# Patient Record
Sex: Male | Born: 1960 | Race: Black or African American | Hispanic: No | Marital: Married | Smoking: Never smoker
Health system: Southern US, Community
[De-identification: ages and names within clinical notes are randomized; demographics above are authoritative.]

## PROBLEM LIST (undated history)

## (undated) ENCOUNTER — Ambulatory Visit: Admission: EM | Payer: PRIVATE HEALTH INSURANCE | Source: Home / Self Care

## (undated) DIAGNOSIS — K219 Gastro-esophageal reflux disease without esophagitis: Secondary | ICD-10-CM

## (undated) HISTORY — DX: Gastro-esophageal reflux disease without esophagitis: K21.9

---

## 2009-03-23 ENCOUNTER — Ambulatory Visit: Payer: Self-pay | Admitting: Internal Medicine

## 2009-03-23 DIAGNOSIS — J3089 Other allergic rhinitis: Secondary | ICD-10-CM | POA: Insufficient documentation

## 2009-03-23 DIAGNOSIS — K219 Gastro-esophageal reflux disease without esophagitis: Secondary | ICD-10-CM

## 2009-03-23 DIAGNOSIS — J45909 Unspecified asthma, uncomplicated: Secondary | ICD-10-CM | POA: Insufficient documentation

## 2009-03-23 HISTORY — DX: Gastro-esophageal reflux disease without esophagitis: K21.9

## 2009-03-23 HISTORY — DX: Other allergic rhinitis: J30.89

## 2009-03-23 LAB — CONVERTED CEMR LAB
Cholesterol: 229 mg/dL — ABNORMAL HIGH (ref 0–200)
HDL: 45 mg/dL (ref >39.00)
PSA: 0.64 ng/mL (ref 0.10–4.00)
Total CHOL/HDL Ratio: 5
Triglycerides: 280 mg/dL — ABNORMAL HIGH (ref 0.0–149.0)

## 2009-03-25 ENCOUNTER — Encounter: Payer: Self-pay | Admitting: Internal Medicine

## 2010-04-18 ENCOUNTER — Ambulatory Visit: Payer: Self-pay | Admitting: Internal Medicine

## 2010-04-18 ENCOUNTER — Encounter: Payer: Self-pay | Admitting: Gastroenterology

## 2010-04-18 ENCOUNTER — Encounter: Payer: Self-pay | Admitting: Internal Medicine

## 2010-04-18 LAB — CONVERTED CEMR LAB
PSA: 0.72 ng/mL (ref 0.10–4.00)
Total CHOL/HDL Ratio: 4
Triglycerides: 108 mg/dL (ref 0.0–149.0)
VLDL: 21.6 mg/dL (ref 0.0–40.0)

## 2010-04-19 ENCOUNTER — Encounter: Payer: Self-pay | Admitting: Internal Medicine

## 2010-05-23 ENCOUNTER — Encounter (INDEPENDENT_AMBULATORY_CARE_PROVIDER_SITE_OTHER): Payer: Self-pay | Admitting: *Deleted

## 2010-05-27 ENCOUNTER — Ambulatory Visit: Payer: Self-pay | Admitting: Gastroenterology

## 2010-06-10 ENCOUNTER — Ambulatory Visit: Payer: Self-pay | Admitting: Gastroenterology

## 2010-06-12 ENCOUNTER — Encounter: Payer: Self-pay | Admitting: Gastroenterology

## 2010-10-01 NOTE — Procedures (Signed)
Summary: Colonoscopy  Patient: Frank Roy Note: All result statuses are Final unless otherwise noted.  Tests: (1) Colonoscopy (COL)   COL Colonoscopy           DONE     [facility]     [address]     [place], [place]  [address]           COLONOSCOPY PROCEDURE REPORT           PATIENT:  [name], [name]  MR#:  [id number]     BIRTHDATE:  [birth year], 49 yrs. old  GENDER:  male     ENDOSCOPIST:  Rachael Fee, MD     REF. BY:  Etta Grandchild, M.D.     PROCEDURE DATE:  06/10/2010     PROCEDURE:  Colonoscopy with biopsy     ASA CLASS:  Class II     INDICATIONS:  Routine Risk Screening     MEDICATIONS:   Fentanyl 25 mcg IV, Versed 4 mg IV     DESCRIPTION OF PROCEDURE:   After the risks benefits and     alternatives of the procedure were thoroughly explained, informed     consent was obtained.  Digital rectal exam was performed and     revealed no rectal masses.   The LB PCF-H180AL [id number] endoscope     was introduced through the anus and advanced to the cecum, which     was identified by both the appendix and ileocecal valve, without     limitations.  The quality of the prep was excellent, using     MoviPrep.  The instrument was then slowly withdrawn as the colon     was fully examined.           <<PROCEDUREIMAGES>>           FINDINGS:  A diminutive polyp was found in the rectum. This was     1-15mm across, removed with forceps and sent to pathology (jar 1)     (see image3).  This was otherwise a normal examination of the     colon (see image1, image2, and image4).   Retroflexed views in the     rectum revealed no abnormalities.    The scope was then withdrawn     from the patient and the procedure completed.           COMPLICATIONS:  None     ENDOSCOPIC IMPRESSION:     1) Diminutive polyp in the rectum, removed and sent to pathology           2) Otherwise normal examination           RECOMMENDATIONS:     1) If the polyp(s) removed today are proven to be  adenomatous     (pre-cancerous) polyps, you will need a repeat colonoscopy in 5     years. Otherwise you should continue to follow colorectal cancer     screening guidelines for "routine risk" patients with colonoscopy     in 10 years.     2) You will receive a letter within 1-2 weeks with the results     of your biopsy as well as final recommendations. Please call my     office if you have not received a letter after 3 weeks.           ______________________________     Rachael Fee, MD           n.  eSIGNED:   Rachael Fee at 06/10/2010 08:52 AM           [name], [id number]  Note: An exclamation mark (!) indicates a result that was not dispersed into the flowsheet. Document Creation Date: 06/10/2010 8:53 AM _______________________________________________________________________  (1) Order result status: Final Collection or observation date-time: 06/10/2010 08:47 Requested date-time:  Receipt date-time:  Reported date-time:  Referring Physician:   Ordering Physician: Rob Bunting [phone]) Specimen Source:  Source: Launa Grill Order Number: [id number] Lab site:   Appended Document: Colonoscopy     Procedures Next Due Date:    Colonoscopy: 06/2020

## 2010-10-01 NOTE — Letter (Signed)
Summary: Lipid Letter  [facility]  [address] [place], [place] [address]   Phone: [phone]  Fax: [phone]    04/19/2010  Frank Roy [address] [place], [place]  [address]  Dear Frank Roy:  We have carefully reviewed your last lipid profile from  and the results are noted below with a summary of recommendations for lipid management.    Cholesterol:       224     Goal: <200   HDL "good" Cholesterol:   57.84     Goal: >40   LDL "bad" Cholesterol:   159     Goal: <130   Triglycerides:       108.0     Goal: <150        TLC Diet (Therapeutic Lifestyle Change): Saturated Fats & Transfatty acids should be kept < 7% of total calories ***Reduce Saturated Fats Polyunstaurated Fat can be up to 10% of total calories Monounsaturated Fat Fat can be up to 20% of total calories Total Fat should be no greater than 25-35% of total calories Carbohydrates should be 50-60% of total calories Protein should be approximately 15% of total calories Fiber should be at least 20-30 grams a day ***Increased fiber may help lower LDL Total Cholesterol should be < 200mg /day Consider adding plant stanol/sterols to diet (example: Benacol spread) ***A higher intake of unsaturated fat may reduce Triglycerides and Increase HDL    Adjunctive Measures (may lower LIPIDS and reduce risk of Heart Attack) include: Aerobic Exercise (20-30 minutes 3-4 times a week) Limit Alcohol Consumption Weight Reduction Aspirin 75-81 mg a day by mouth (if not allergic or contraindicated) Dietary Fiber 20-30 grams a day by mouth     Current Medications: 1)    Proair Hfa 108 (90 Base) Mcg/act Aers (Albuterol sulfate) .Marland Kitchen.. 1-2 puffs qid as needed for wheezing  If you have any questions, please call. We appreciate being able to work with you.   Sincerely,    [facility] Etta Grandchild MD

## 2010-10-01 NOTE — Letter (Signed)
Summary: Results Letter  [facility]  [address] [place], [place] [address]   Phone: [phone]  Fax: [phone]        June 12, 2010 MRN: [id number]    Frank Roy [address] [place], [place]  [address]    Dear Mr. Gloss,   Good news.  The polyp that was removed during your recent procedure was NOT pre-cancerous.  You should continue to follow current colorectal cancer screening guidelines with a repeat colonoscopy in 10 years.  We will therefore put your information in our reminder system and will contact you in 10 years to schedule a repeat procedure.  Please call if you have any questions or concerns.       Sincerely,  Rachael Fee MD  This letter has been electronically signed by your physician.  Appended Document: Results Letter letter mailed

## 2010-10-01 NOTE — Letter (Signed)
Summary: [facility] Instructions  [facility]  [address] [place], [place] [address]   Phone: [phone]  Fax: [phone]       Frank Roy    1961-08-11    MRN: [id number]        Procedure Day /Date: 06/10/10   Monday     Arrival Time:  7:30am      Procedure Time: 8:30am     Location of Procedure:                    _x _  [facility] (4th Floor)   PREPARATION FOR COLONOSCOPY WITH MOVIPREP   Starting 5 days prior to your procedure _10/5/11 _ do not eat nuts, seeds, popcorn, corn, beans, peas,  salads, or any raw vegetables.  Do not take any fiber supplements (e.g. Metamucil, Citrucel, and Benefiber).  THE DAY BEFORE YOUR PROCEDURE         DATE:  06/09/10  DAY:  Sunday  1.  Drink clear liquids the entire day-NO SOLID FOOD  2.  Do not drink anything colored red or purple.  Avoid juices with pulp.  No orange juice.  3.  Drink at least 64 oz. (8 glasses) of fluid/clear liquids during the day to prevent dehydration and help the prep work efficiently.  CLEAR LIQUIDS INCLUDE: Water Jello Ice Popsicles Tea (sugar ok, no milk/cream) Powdered fruit flavored drinks Coffee (sugar ok, no milk/cream) Gatorade Juice: apple, white grape, white cranberry  Lemonade Clear bullion, consomm, broth Carbonated beverages (any kind) Strained chicken noodle soup Hard Candy                             4.  In the morning, mix first dose of MoviPrep solution:    Empty 1 Pouch A and 1 Pouch B into the disposable container    Add lukewarm drinking water to the top line of the container. Mix to dissolve    Refrigerate (mixed solution should be used within 24 hrs)  5.  Begin drinking the prep at 5:00 p.m. The MoviPrep container is divided by 4 marks.   Every 15 minutes drink the solution down to the next mark (approximately 8 oz) until the full liter is complete.   6.  Follow completed prep with 16 oz of clear liquid of your choice (Nothing red or purple).   Continue to drink clear liquids until bedtime.  7.  Before going to bed, mix second dose of MoviPrep solution:    Empty 1 Pouch A and 1 Pouch B into the disposable container    Add lukewarm drinking water to the top line of the container. Mix to dissolve    Refrigerate  THE DAY OF YOUR PROCEDURE      DATE:  06/10/10  DAY:  Monday  Beginning at  3:30 a.m. (5 hours before procedure):         1. Every 15 minutes, drink the solution down to the next mark (approx 8 oz) until the full liter is complete.  2. Follow completed prep with 16 oz. of clear liquid of your choice.    3. You may drink clear liquids until  6:30am  (2 HOURS BEFORE PROCEDURE).   MEDICATION INSTRUCTIONS  Unless otherwise instructed, you should take regular prescription medications with a small sip of water   as early as possible the morning of your procedure.         OTHER  INSTRUCTIONS  You will need a responsible adult at least 51 years of age to accompany you and drive you home.   This person must remain in the waiting room during your procedure.  Wear loose fitting clothing that is easily removed.  Leave jewelry and other valuables at home.  However, you may wish to bring a book to read or  an iPod/MP3 player to listen to music as you wait for your procedure to start.  Remove all body piercing jewelry and leave at home.  Total time from sign-in until discharge is approximately 2-3 hours.  You should go home directly after your procedure and rest.  You can resume normal activities the  day after your procedure.  The day of your procedure you should not:   Drive   Make legal decisions   Operate machinery   Drink alcohol   Return to work  You will receive specific instructions about eating, activities and medications before you leave.    The above instructions have been reviewed and explained to me by  Karl Bales RN  May 27, 2010 9:18 AM    I fully understand and can  verbalize these instructions _____________________________ Date _________

## 2010-10-01 NOTE — Letter (Signed)
Summary: Previsit letter  [facility]  [address] [place], [place] [address]   Phone: [phone]  Fax: [phone]       04/18/2010 MRN: [id number]  Frank Roy [address] [place], [place]  [address]  Dear [name],  Welcome to the Gastroenterology Division at [facility].    You are scheduled to see a nurse for your pre-procedure visit on 05-27-10 at 9am on the 3rd floor at [facility], [address]  We ask that you try to arrive at our office 15 minutes prior to your appointment time to allow for check-in.  Your nurse visit will consist of discussing your medical and surgical history, your immediate family medical history, and your medications.    Please bring a complete list of all your medications or, if you prefer, bring the medication bottles and we will list them.  We will need to be aware of both prescribed and over the counter drugs.  We will need to know exact dosage information as well.  If you are on blood thinners (Coumadin, Plavix, Aggrenox, Ticlid, etc.) please call our office today/prior to your appointment, as we need to consult with your physician about holding your medication.   Please be prepared to read and sign documents such as consent forms, a financial agreement, and acknowledgement forms.  If necessary, and with your consent, a friend or relative is welcome to sit-in on the nurse visit with you.  Please bring your insurance card so that we may make a copy of it.  If your insurance requires a referral to see a specialist, please bring your referral form from your primary care physician.  No co-pay is required for this nurse visit.     If you cannot keep your appointment, please call [phone] to cancel or reschedule prior to your appointment date.  This allows [place] the opportunity to schedule an appointment for another patient in need of care.    Thank you for choosing [facility] for your medical needs.   We appreciate the opportunity to care for you.  Please visit [place] at our website  to learn more about our practice.                     Sincerely.                                                                                                                   The Gastroenterology Division

## 2010-10-01 NOTE — Assessment & Plan Note (Signed)
Summary: CPX/LAB AFTER APPT/BCBS/PN   Vital Signs:  Patient profile:   50 year old male Height:      70 inches Weight:      194.38 pounds BMI:     27.99 O2 Sat:      97 % on Room air Temp:     97.1 degrees F oral Pulse rate:   60 / minute Pulse rhythm:   regular Resp:     16 per minute BP sitting:   104 / 70  (left arm) Cuff size:   large  Vitals Entered By: Rock Nephew CMA (April 18, 2010 9:02 AM)  Nutrition Counseling: Patient's BMI is greater than 25 and therefore counseled on weight management options.  O2 Flow:  Room air CC: cpx w/labs, Preventive Care Is Patient Diabetic? No Pain Assessment Patient in pain? no       Does patient need assistance? Functional Status Self care Ambulation Normal   Primary Care Provider:  jones  CC:  cpx w/labs and Preventive Care.  History of Present Illness:  Follow-Up Visit      This is a 50 year old man who presents for Follow-up visit.  The patient denies chest pain, palpitations, dizziness, syncope, edema, SOB, DOE, PND, and orthopnea.  Since the last visit the patient notes no new problems or concerns.    Preventive Screening-Counseling & Management  Alcohol-Tobacco     Alcohol drinks/day: 0     Smoking Status: never  Hep-HIV-STD-Contraception     Hepatitis Risk: no risk noted     HIV Risk: no risk noted     STD Risk: no risk noted     Dental Visit-last 6 months yes     Dental Care Counseling: to seek dental care; no dental care within six months     TSE monthly: yes     Testicular SE Education/Counseling to perform regular STE  Safety-Violence-Falls     Seat Belt Use: yes     Helmet Use: no     Firearms in the Home: no firearms in the home     Smoke Detectors: yes     Violence in the Home: no risk noted     Sexual Abuse: no      Sexual History:  currently monogamous.        Drug Use:  no.        Blood Transfusions:  no.    Clinical Review Panels:  Prevention   Last PSA:  0.64  (03/23/2009)  Lipid Management   Cholesterol:  229 (03/23/2009)   HDL (good cholesterol):  45.00 (03/23/2009)   Problems Prior to Update: 1)  Routine General Medical Exam@health  Care Facl  (ICD-V70.0) 2)  Allergic Rhinitis Due To Other Allergen  (ICD-477.8) 3)  Gerd  (ICD-530.81) 4)  Asthma  (ICD-493.90)  Medications Prior to Update: 1)  Proair Hfa 108 (90 Base) Mcg/act Aers (Albuterol Sulfate) .Marland Kitchen.. 1-2 Puffs Qid As Needed For Wheezing  Current Medications (verified): 1)  Proair Hfa 108 (90 Base) Mcg/act Aers (Albuterol Sulfate) .Marland Kitchen.. 1-2 Puffs Qid As Needed For Wheezing  Allergies (verified): No Known Drug Allergies  Past History:  Past Medical History: Last updated: 03/23/2009 Asthma GERD  Past Surgical History: Last updated: 03/23/2009 Denies surgical history  Family History: Last updated: 04/18/2010 none  Social History: Last updated: 03/23/2009 Occupation: Track Psychologist, occupational at [employer] Married Never Smoked Alcohol use-no Drug use-no Regular exercise-yes  Risk Factors: Alcohol Use: 0 (04/18/2010) Exercise: yes (03/23/2009)  Risk Factors: Smoking Status: never (04/18/2010)  Family History: Reviewed history and no changes required. none  Social History: Reviewed history from 03/23/2009 and no changes required. Occupation: Product manager at [employer] Married Never Smoked Alcohol use-no Drug use-no Regular exercise-yes Hepatitis Risk:  no risk noted HIV Risk:  no risk noted STD Risk:  no risk noted Dental Care w/in 6 mos.:  yes Seat Belt Use:  yes Sexual History:  currently monogamous Blood Transfusions:  no  Review of Systems  The patient denies anorexia, fever, weight loss, weight gain, chest pain, syncope, dyspnea on exertion, peripheral edema, prolonged cough, headaches, hemoptysis, abdominal pain, melena, hematochezia, severe indigestion/heartburn, hematuria, suspicious skin lesions, difficulty walking, depression, abnormal bleeding, enlarged lymph  nodes, angioedema, and testicular masses.    Physical Exam  General:  alert, well-developed, well-nourished, well-hydrated, normal appearance, healthy-appearing, cooperative to examination, and good hygiene.   Head:  normocephalic and atraumatic.   Eyes:  vision grossly intact, pupils equal, pupils round, and pupils reactive to light.   Ears:  R ear normal and L ear normal.   Mouth:  good dentition, no gingival abnormalities, pharynx pink and moist, no erythema, no exudates, no posterior lymphoid hypertrophy, and no postnasal drip.   Neck:  supple, full ROM, no masses, no carotid bruits, no cervical lymphadenopathy, and no neck tenderness.   Chest Wall:  No deformities, masses, tenderness or gynecomastia noted. Breasts:  No masses or gynecomastia noted Lungs:  Normal respiratory effort, chest expands symmetrically. Lungs are clear to auscultation, no crackles or wheezes. Heart:  Normal rate and regular rhythm. S1 and S2 normal without gallop, murmur, click, rub or other extra sounds. Abdomen:  Bowel sounds positive,abdomen soft and non-tender without masses, organomegaly or hernias noted. Rectal:  No external abnormalities noted. Normal sphincter tone. No rectal masses or tenderness. heme negative stool. Genitalia:  circumcised, no hydrocele, no varicocele, no scrotal masses, no testicular masses or atrophy, no cutaneous lesions, and no urethral discharge.   Prostate:  no gland enlargement, no nodules, no asymmetry, and no induration.   Msk:  No deformity or scoliosis noted of thoracic or lumbar spine.   Pulses:  R and L carotid,radial,femoral,dorsalis pedis and posterior tibial pulses are full and equal bilaterally Extremities:  No clubbing, cyanosis, edema, or deformity noted with normal full range of motion of all joints.   Neurologic:  No cranial nerve deficits noted. Station and gait are normal. Plantar reflexes are down-going bilaterally. DTRs are symmetrical throughout. Sensory, motor and  coordinative functions appear intact. Skin:  turgor normal, color normal, no rashes, no suspicious lesions, no ecchymoses, no petechiae, no purpura, no ulcerations, no edema, and tattoo(s).   Cervical Nodes:  No lymphadenopathy noted Axillary Nodes:  No palpable lymphadenopathy Inguinal Nodes:  no R inguinal adenopathy and no L inguinal adenopathy.   Psych:  Cognition and judgment appear intact. Alert and cooperative with normal attention span and concentration. No apparent delusions, illusions, hallucinations   Impression & Recommendations:  Problem # 1:  ROUTINE GENERAL MEDICAL EXAM@HEALTH  CARE FACL (ICD-V70.0) Assessment Unchanged  Orders: Venipuncture ([address]) TLB-Lipid Panel (80061-LIPID) TLB-PSA (Prostate Specific Antigen) (84153-PSA) Gastroenterology Referral (GI) Hemoccult Guaiac-1 spec.(in office) (82270) EKG w/ Interpretation (93000)  Pneumovax: Pneumovax (03/23/2009) Chol: 229 (03/23/2009)   HDL: 45.00 (03/23/2009)   TG: 280.0 (03/23/2009) PSA: 0.64 (03/23/2009)  Discussed using sunscreen, use of alcohol, drug use, self testicular exam, routine dental care, routine eye care, routine physical exam, seat belts, multiple vitamins, and recommendations for immunizations.  Discussed exercise and checking cholesterol.  Also recommend checking PSA.  Complete Medication List: 1)  Proair Hfa 108 (90 Base) Mcg/act Aers (Albuterol sulfate) .Marland Kitchen.. 1-2 puffs qid as needed for wheezing  Colorectal Screening:  Current Recommendations:    Hemoccult: NEG X 1 today    Colonoscopy recommended: scheduled with [facility].  PSA Screening:    PSA: 0.64  (03/23/2009)    Reviewed PSA screening recommendations: PSA ordered  Immunization & Chemoprophylaxis:    Pneumovax: Pneumovax  (03/23/2009)  Patient Instructions: 1)  Please schedule a follow-up appointment as needed. 2)  It is important that you exercise regularly at least 20 minutes 5 times a week. If you develop chest pain, have severe  difficulty breathing, or feel very tired , stop exercising immediately and seek medical attention. 3)  You need to lose weight. Consider a lower calorie diet and regular exercise.  4)  If you are having sex and you or your partner don't want a child, use contraception.

## 2010-10-01 NOTE — Miscellaneous (Signed)
Summary: [facility] previsit  Clinical Lists Changes  Medications: Added new medication of MOVIPREP 100 GM  SOLR (PEG-KCL-NACL-NASULF-NA ASC-C) As per prep instructions. - Signed Rx of MOVIPREP 100 GM  SOLR (PEG-KCL-NACL-NASULF-NA ASC-C) As per prep instructions.;  #1 x 0;  Signed;  Entered by: Karl Bales RN;  Authorized by: Rachael Fee MD;  Method used: Electronically to [id number] [address] [place], [place]  [address], Ph: [phone], Fax: [phone]    Prescriptions: MOVIPREP 100 GM  SOLR (PEG-KCL-NACL-NASULF-NA ASC-C) As per prep instructions.  #1 x 0   Entered by:   Karl Bales RN   Authorized by:   Rachael Fee MD   Signed by:   Karl Bales RN on 05/27/2010   Method used:   Electronically to        [facility] [address] [id number]* (retail)       [address] [place], [place]  [address]       Ph: [phone]       Fax: [phone]   RxID:   [id number]

## 2011-04-03 ENCOUNTER — Ambulatory Visit (INDEPENDENT_AMBULATORY_CARE_PROVIDER_SITE_OTHER): Payer: BC Managed Care – PPO | Admitting: Internal Medicine

## 2011-04-03 ENCOUNTER — Encounter: Payer: Self-pay | Admitting: Internal Medicine

## 2011-04-03 VITALS — BP 120/70 | HR 57 | Temp 98.7°F | Resp 16 | Wt 190.2 lb

## 2011-04-03 DIAGNOSIS — J3089 Other allergic rhinitis: Secondary | ICD-10-CM

## 2011-04-03 DIAGNOSIS — N529 Male erectile dysfunction, unspecified: Secondary | ICD-10-CM

## 2011-04-03 MED ORDER — VARDENAFIL HCL 10 MG PO TBDP: 1.0000 | ORAL_TABLET | ORAL | Status: DC | PRN

## 2011-04-03 MED ORDER — MOMETASONE FUROATE 50 MCG/ACT NA SUSP: 2.0000 | Freq: Every day | NASAL | Status: DC

## 2011-04-03 NOTE — Assessment & Plan Note (Signed)
Start nasonex ns 

## 2011-04-03 NOTE — Assessment & Plan Note (Signed)
Try staxyn

## 2011-04-03 NOTE — Patient Instructions (Signed)
Allergic Rhinitis Allergic rhinitis is when the mucous membranes in the nose respond to allergens. Allergens are particles in the air that cause your body to have an allergic reaction. This causes you to release allergic antibodies. Through a chain of events, these eventually cause you to release histamine into the blood stream (hence the use of antihistamines). Although meant to be protective to the body, it is this release that causes your discomfort, such as frequent sneezing, congestion and an itchy runny nose.  CAUSES The pollen allergens may come from grasses, trees, and weeds. This is seasonal allergic rhinitis, or "hay fever." Other allergens cause year-round allergic rhinitis (perennial allergic rhinitis) such as house dust mite allergen, pet dander and mold spores.  SYMPTOMS  Nasal stuffiness (congestion).   Runny, itchy nose with sneezing and tearing of the eyes.   There is often an itching of the mouth, eyes and ears.  It cannot be cured, but it can be controlled with medications. DIAGNOSIS If you are unable to determine the offending allergen, skin or blood testing may find it. TREATMENT  Avoid the allergen.   Medications and allergy shots (immunotherapy) can help.   Hay fever may often be treated with antihistamines in pill or nasal spray forms. Antihistamines block the effects of histamine. There are over-the-counter medicines that may help with nasal congestion and swelling around the eyes. Check with your caregiver before taking or giving this medicine.  If the treatment above does not work, there are many new medications your caregiver can prescribe. Stronger medications may be used if initial measures are ineffective. Desensitizing injections can be used if medications and avoidance fails. Desensitization is when a patient is given ongoing shots until the body becomes less sensitive to the allergen. Make sure you follow up with your caregiver if problems continue. SEEK  MEDICAL CARE IF:   You develop fever (more than 100.5F (38.1 C).   You develop a cough that does not stop easily (persistent).   You have shortness of breath.   You start wheezing.   Symptoms interfere with normal daily activities.  Document Released: 05/13/2001 Document Re-Released: 09/09/2009 ExitCare Patient Information 2011 [employer]. 

## 2011-04-03 NOTE — Progress Notes (Signed)
Subjective:    Patient ID: Frank Roy, male    DOB: 1960/12/01, 50 y.o.   MRN: [id number]  Erectile Dysfunction This is a chronic problem. The current episode started more than 1 year ago. The problem is unchanged. The nature of his difficulty is maintaining erection, achieving erection and penetration. He reports no anxiety, decreased libido or performance anxiety. He reports his erection duration to be 1 to 5 minutes. Irritative symptoms do not include frequency, nocturia or urgency. Obstructive symptoms do not include dribbling, incomplete emptying, an intermittent stream, a slower stream, straining or a weak stream. Pertinent negatives include no chills, dysuria, genital pain, hematuria or hesitancy. The symptoms are aggravated by nothing. Past treatments include nothing.  Allergic Reaction This is a recurrent problem. The current episode started more than 1 week ago. The problem occurs intermittently. The problem has been gradually worsening since onset. The problem is moderate. Associated with: grasses in [place]. The time of exposure is unknown. Pertinent negatives include no abdominal pain, chest pain, chest pressure, coughing, diarrhea, difficulty breathing, drooling, eye itching, eye redness, eye watering, globus sensation, hyperventilation, itching, rash, stridor, trouble swallowing, vomiting or wheezing. There is no swelling present. Past treatments include nothing. His past medical history is significant for seasonal allergies.      Review of Systems  Constitutional: Negative for fever, chills, diaphoresis, activity change, appetite change, fatigue and unexpected weight change.  HENT: Positive for congestion, rhinorrhea, sneezing and postnasal drip. Negative for hearing loss, ear pain, nosebleeds, sore throat, drooling, mouth sores, trouble swallowing, neck pain, neck stiffness, dental problem, voice change, sinus pressure, tinnitus and ear discharge.   Eyes: Negative for photophobia,  pain, discharge, redness, itching and visual disturbance.  Respiratory: Negative for apnea, cough, choking, shortness of breath, wheezing and stridor.   Cardiovascular: Negative for chest pain, palpitations and leg swelling.  Gastrointestinal: Negative for nausea, vomiting, abdominal pain, diarrhea, constipation, abdominal distention, anal bleeding and rectal pain.  Genitourinary: Negative for dysuria, hesitancy, urgency, frequency, hematuria, flank pain, decreased urine volume, discharge, penile swelling, scrotal swelling, enuresis, difficulty urinating, genital sores, penile pain, testicular pain, decreased libido, incomplete emptying and nocturia.  Musculoskeletal: Negative.   Skin: Negative for color change, itching, pallor, rash and wound.  Neurological: Negative.   Hematological: Negative for adenopathy. Does not bruise/bleed easily.  Psychiatric/Behavioral: Negative.        Objective:   Physical Exam  Vitals reviewed. Constitutional: He is oriented to person, place, and time. He appears well-developed and well-nourished. No distress.  HENT:  Head: Normocephalic and atraumatic. No trismus in the jaw.  Right Ear: Hearing, tympanic membrane, external ear and ear canal normal.  Left Ear: Hearing, tympanic membrane, external ear and ear canal normal.  Nose: Mucosal edema and rhinorrhea present. No nose lacerations, sinus tenderness, nasal deformity, septal deviation or nasal septal hematoma. No epistaxis.  No foreign bodies. Right sinus exhibits no maxillary sinus tenderness and no frontal sinus tenderness. Left sinus exhibits no maxillary sinus tenderness and no frontal sinus tenderness.  Mouth/Throat: Uvula is midline and mucous membranes are normal. Mucous membranes are not pale, not dry and not cyanotic. No oral lesions. No uvula swelling. No oropharyngeal exudate, posterior oropharyngeal edema, posterior oropharyngeal erythema or tonsillar abscesses.  Eyes: Conjunctivae and EOM are  normal. Pupils are equal, round, and reactive to light. Right eye exhibits no discharge. Left eye exhibits no discharge. No scleral icterus.  Neck: Normal range of motion. Neck supple. No JVD present. No tracheal deviation present. No thyromegaly present.  Cardiovascular: Normal rate, regular rhythm, normal heart sounds and intact distal pulses.  Exam reveals no gallop and no friction rub.   No murmur heard. Pulmonary/Chest: Effort normal and breath sounds normal. No stridor. No respiratory distress. He has no wheezes. He has no rales. He exhibits no tenderness.  Abdominal: Soft. Bowel sounds are normal. He exhibits no distension. There is no tenderness. There is no rebound and no guarding.  Musculoskeletal: Normal range of motion. He exhibits no edema and no tenderness.  Lymphadenopathy:    He has no cervical adenopathy.  Neurological: He is alert and oriented to person, place, and time. He has normal reflexes. He displays normal reflexes. No cranial nerve deficit. He exhibits normal muscle tone. Coordination normal.  Skin: Skin is warm and dry. No rash noted. He is not diaphoretic. No erythema. No pallor.  Psychiatric: He has a normal mood and affect. His behavior is normal. Judgment and thought content normal.      Lab Results  Component Value Date   CHOL 224* 04/18/2010   TRIG 108.0 04/18/2010   HDL 50.00 04/18/2010   LDLDIRECT 158.5 04/18/2010   PSA 0.72 04/18/2010      Assessment & Plan:

## 2013-08-24 ENCOUNTER — Ambulatory Visit: Payer: BC Managed Care – PPO | Admitting: Internal Medicine

## 2013-08-24 ENCOUNTER — Ambulatory Visit (INDEPENDENT_AMBULATORY_CARE_PROVIDER_SITE_OTHER): Payer: BC Managed Care – PPO | Admitting: Internal Medicine

## 2013-08-24 ENCOUNTER — Encounter: Payer: Self-pay | Admitting: Internal Medicine

## 2013-08-24 VITALS — BP 126/86 | HR 83 | Temp 97.6°F | Resp 16 | Ht 71.0 in | Wt 206.2 lb

## 2013-08-24 DIAGNOSIS — Z5189 Encounter for other specified aftercare: Secondary | ICD-10-CM | POA: Insufficient documentation

## 2013-08-24 NOTE — Patient Instructions (Signed)
Wound Care Wound care helps prevent pain and infection.  You may need a tetanus shot if:  You cannot remember when you had your last tetanus shot.  You have never had a tetanus shot.  The injury broke your skin. If you need a tetanus shot and you choose not to have one, you may get tetanus. Sickness from tetanus can be serious. HOME CARE   Only take medicine as told by your doctor.  Clean the wound daily with mild soap and water.  Change any bandages (dressings) as told by your doctor.  Put medicated cream and a bandage on the wound as told by your doctor.  Change the bandage if it gets wet, dirty, or starts to smell.  Take showers. Do not take baths, swim, or do anything that puts your wound under water.  Rest and raise (elevate) the wound until the pain and puffiness (swelling) are better.  Keep all doctor visits as told. GET HELP RIGHT AWAY IF:   Yellowish-white fluid (pus) comes from the wound.  Medicine does not lessen your pain.  There is a red streak going away from the wound.  You have a fever. MAKE SURE YOU:   Understand these instructions.  Will watch your condition.  Will get help right away if you are not doing well or get worse. Document Released: 05/27/2008 Document Revised: 11/10/2011 Document Reviewed: 12/22/2010 [employer] Patient Information 2014 [employer].  

## 2013-08-24 NOTE — Progress Notes (Signed)
   Subjective:    Patient ID: Frank Roy, male    DOB: [birth year], 52 y.o.   MRN: [id number]  HPI Comments: He has right knee surgery in [place], [place] about 2 weeks ago but he is up here now and needs the stitches removed from his right knee.  Wound Check He was originally treated 10 to 14 days ago. Previous treatment included laceration repair. The maximum temperature noted was less than 100.4 F. There has been no drainage from the wound. There is no redness present. There is no swelling present. The pain has no pain. He has no difficulty moving the affected extremity or digit.      Review of Systems  All other systems reviewed and are negative.       Objective:   Physical Exam  Musculoskeletal:       Right knee: He exhibits normal range of motion, no swelling, no effusion, no ecchymosis, no deformity, no laceration, no erythema, normal alignment, no LCL laxity, normal patellar mobility and no bony tenderness. No tenderness found.       Legs:         Assessment & Plan:

## 2013-11-10 ENCOUNTER — Telehealth: Payer: Self-pay | Admitting: Internal Medicine

## 2013-11-10 NOTE — Telephone Encounter (Signed)
Frank Roy with Dr. Francella Roy's office at [facility] in [place] is calling on behalf of the patient to see if Dr. Yetta Roy can rx a stronger pain medicine for the patient's pain so that the patient will not have to drive back to [place] before his next post-op visit with them to get a stronger pain med.    Frank Lushndrea states that the patient had a LT knee scope and was given Hydrocodone 75-325 on Tuesday by Dr. Modena Roy.  The patient called their office today to report increased swelling and says that his Hydrocone it is not helping with pain. The patient lives in [place] and Dr. Modena Roy is okay with him having something stronger such as Percocet. Please advise.   Frank Lushndrea can be reached back at [phone] with any questions.

## 2013-11-10 NOTE — Telephone Encounter (Signed)
Pt called to follow up on this request. Please advise.

## 2013-11-11 NOTE — Telephone Encounter (Signed)
Pt is upset that no one has called to answer his message.  He made an appt for a physical for Wed March 18.

## 2013-11-11 NOTE — Telephone Encounter (Signed)
He needs to be seen

## 2013-11-14 NOTE — Telephone Encounter (Signed)
Scheduled on March 18.

## 2013-11-16 ENCOUNTER — Ambulatory Visit (INDEPENDENT_AMBULATORY_CARE_PROVIDER_SITE_OTHER): Payer: BC Managed Care – PPO | Admitting: Internal Medicine

## 2013-11-16 ENCOUNTER — Other Ambulatory Visit (INDEPENDENT_AMBULATORY_CARE_PROVIDER_SITE_OTHER): Payer: BC Managed Care – PPO

## 2013-11-16 ENCOUNTER — Encounter: Payer: Self-pay | Admitting: Internal Medicine

## 2013-11-16 VITALS — BP 140/100 | HR 102 | Temp 97.8°F | Resp 16 | Ht 70.0 in | Wt 207.4 lb

## 2013-11-16 DIAGNOSIS — Z0000 Encounter for general adult medical examination without abnormal findings: Secondary | ICD-10-CM

## 2013-11-16 DIAGNOSIS — I10 Essential (primary) hypertension: Secondary | ICD-10-CM | POA: Insufficient documentation

## 2013-11-16 DIAGNOSIS — Z23 Encounter for immunization: Secondary | ICD-10-CM

## 2013-11-16 HISTORY — DX: Encounter for general adult medical examination without abnormal findings: Z00.00

## 2013-11-16 LAB — CBC WITH DIFFERENTIAL/PLATELET
BASOS ABS: 0 10*3/uL (ref 0.0–0.1)
Basophils Relative: 0 % (ref 0.0–3.0)
EOS ABS: 0.4 10*3/uL (ref 0.0–0.7)
Eosinophils Relative: 4.5 % (ref 0.0–5.0)
HCT: 42.8 % (ref 39.0–52.0)
Hemoglobin: 14.3 g/dL (ref 13.0–17.0)
LYMPHS PCT: 21.2 % (ref 12.0–46.0)
Lymphs Abs: 2 10*3/uL (ref 0.7–4.0)
MCHC: 33.3 g/dL (ref 30.0–36.0)
MCV: 87.4 fl (ref 78.0–100.0)
MONOS PCT: 6.3 % (ref 3.0–12.0)
Monocytes Absolute: 0.6 10*3/uL (ref 0.1–1.0)
NEUTROS PCT: 68 % (ref 43.0–77.0)
Neutro Abs: 6.4 10*3/uL (ref 1.4–7.7)
PLATELETS: 294 10*3/uL (ref 150.0–400.0)
RBC: 4.9 Mil/uL (ref 4.22–5.81)
RDW: 13.9 % (ref 11.5–14.6)
WBC: 9.3 10*3/uL (ref 4.5–10.5)

## 2013-11-16 LAB — URINALYSIS, ROUTINE W REFLEX MICROSCOPIC
Bilirubin Urine: NEGATIVE
Ketones, ur: NEGATIVE
LEUKOCYTES UA: NEGATIVE
NITRITE: NEGATIVE
SPECIFIC GRAVITY, URINE: 1.025 (ref 1.000–1.030)
Total Protein, Urine: NEGATIVE
UROBILINOGEN UA: 0.2 (ref 0.0–1.0)
Urine Glucose: NEGATIVE
pH: 5.5 (ref 5.0–8.0)

## 2013-11-16 LAB — LIPID PANEL
CHOL/HDL RATIO: 5
CHOLESTEROL: 238 mg/dL — AB (ref 0–200)
HDL: 50.5 mg/dL (ref >39.00)
LDL CALC: 134 mg/dL — AB (ref 0–99)
TRIGLYCERIDES: 268 mg/dL — AB (ref 0.0–149.0)
VLDL: 53.6 mg/dL — ABNORMAL HIGH (ref 0.0–40.0)

## 2013-11-16 LAB — COMPREHENSIVE METABOLIC PANEL
ALBUMIN: 4.2 g/dL (ref 3.5–5.2)
ALT: 24 U/L (ref 0–53)
AST: 21 U/L (ref 0–37)
Alkaline Phosphatase: 94 U/L (ref 39–117)
BUN: 11 mg/dL (ref 6–23)
CALCIUM: 9.6 mg/dL (ref 8.4–10.5)
CHLORIDE: 101 meq/L (ref 96–112)
CO2: 31 meq/L (ref 19–32)
CREATININE: 1.1 mg/dL (ref 0.4–1.5)
GFR: 87.86 mL/min (ref >60.00)
Glucose, Bld: 110 mg/dL — ABNORMAL HIGH (ref 70–99)
POTASSIUM: 3.9 meq/L (ref 3.5–5.1)
Sodium: 139 mEq/L (ref 135–145)
Total Bilirubin: 0.6 mg/dL (ref 0.3–1.2)
Total Protein: 7.7 g/dL (ref 6.0–8.3)

## 2013-11-16 LAB — TSH: TSH: 1.67 u[IU]/mL (ref 0.35–5.50)

## 2013-11-16 LAB — PSA: PSA: 1.65 ng/mL (ref 0.10–4.00)

## 2013-11-16 NOTE — Progress Notes (Signed)
Pre visit review using our clinic review tool, if applicable. No additional management support is needed unless otherwise documented below in the visit note. 

## 2013-11-16 NOTE — Patient Instructions (Signed)
Health Maintenance, Males A healthy lifestyle and preventative care can promote health and wellness.  Maintain regular health, dental, and eye exams.  Eat a healthy diet. Foods like vegetables, fruits, whole grains, low-fat dairy products, and lean protein foods contain the nutrients you need and are low in calories. Decrease your intake of foods high in solid fats, added sugars, and salt. Get information about a proper diet from your health care provider, if necessary.  Regular physical exercise is one of the most important things you can do for your health. Most adults should get at least 150 minutes of moderate-intensity exercise (any activity that increases your heart rate and causes you to sweat) each week. In addition, most adults need muscle-strengthening exercises on 2 or more days a week.   Maintain a healthy weight. The body mass index (BMI) is a screening tool to identify possible weight problems. It provides an estimate of body fat based on height and weight. Your health care provider can find your BMI and can help you achieve or maintain a healthy weight. For males 20 years and older:  A BMI below 18.5 is considered underweight.  A BMI of 18.5 to 24.9 is normal.  A BMI of 25 to 29.9 is considered overweight.  A BMI of 30 and above is considered obese.  Maintain normal blood lipids and cholesterol by exercising and minimizing your intake of saturated fat. Eat a balanced diet with plenty of fruits and vegetables. Blood tests for lipids and cholesterol should begin at age 20 and be repeated every 5 years. If your lipid or cholesterol levels are high, you are over 50, or you are at high risk for heart disease, you may need your cholesterol levels checked more frequently.Ongoing high lipid and cholesterol levels should be treated with medicines, if diet and exercise are not working.  If you smoke, find out from your health care provider how to quit. If you do not use tobacco, do not  start.  Lung cancer screening is recommended for adults aged 55 80 years who are at high risk for developing lung cancer because of a history of smoking. A yearly low-dose CT scan of the lungs is recommended for people who have at least a 30-pack-year history of smoking and are a current smoker or have quit within the past 15 years. A pack year of smoking is smoking an average of 1 pack of cigarettes a day for 1 year (for example, a 30-pack-year history of smoking could mean smoking 1 pack a day for 30 years or 2 packs a day for 15 years). Yearly screening should continue until the smoker has stopped smoking for at least 15 years. Yearly screening should be stopped for people who develop a health problem that would prevent them from having lung cancer treatment.  If you choose to drink alcohol, do not have more than 2 drinks per day. One drink is considered to be 12 oz (360 mL) of beer, 5 oz (150 mL) of wine, or 1.5 oz (45 mL) of liquor.  Avoid use of street drugs. Do not share needles with anyone. Ask for help if you need support or instructions about stopping the use of drugs.  High blood pressure causes heart disease and increases the risk of stroke. Blood pressure should be checked at least every 1 2 years. Ongoing high blood pressure should be treated with medicines if weight loss and exercise are not effective.  If you are 45 54 years old, ask your health   care provider if you should take aspirin to prevent heart disease.  Diabetes screening involves taking a blood sample to check your fasting blood sugar level. This should be done once every 3 years after age 45, if you are at a normal weight and without risk factors for diabetes. Testing should be considered at a younger age or be carried out more frequently if you are overweight and have at least 1 risk factor for diabetes.  Colorectal cancer can be detected and often prevented. Most routine colorectal cancer screening begins at the age of 50  and continues through age 75. However, your health care provider may recommend screening at an earlier age if you have risk factors for colon cancer. On a yearly basis, your health care provider may provide home test kits to check for hidden blood in the stool. A small camera at the end of a tube may be used to directly examine the colon (sigmoidoscopy or colonoscopy) to detect the earliest forms of colorectal cancer. Talk to your health care provider about this at age 50, when routine screening begins. A direct exam of the colon should be repeated every 5 10 years through age 75, unless early forms of pre-cancerous polyps or small growths are found.  People who are at an increased risk for hepatitis B should be screened for this virus. You are considered at high risk for hepatitis B if:  You were born in a country where hepatitis B occurs often. Talk with your health care provider about which countries are considered high-risk.  Your parents were born in a high-risk country and you have not received a shot to protect against hepatitis B (hepatitis B vaccine).  You have HIV or AIDS.  You use needles to inject street drugs.  You live with, or have sex with, someone who has hepatitis B.  You are a man who has sex with other men (MSM).  You get hemodialysis treatment.  You take certain medicines for conditions like cancer, organ transplantation, and autoimmune conditions.  Hepatitis C blood testing is recommended for all people born from 1945 through 1965 and any individual with known risk factors for hepatitis C.  Healthy men should no longer receive prostate-specific antigen (PSA) blood tests as part of routine cancer screening. Talk to your health care provider about prostate cancer screening.  Testicular cancer screening is not recommended for adolescents or adult males who have no symptoms. Screening includes self-exam, a health care provider exam, and other screening tests. Consult with  your health care provider about any symptoms you have or any concerns you have about testicular cancer.  Practice safe sex. Use condoms and avoid high-risk sexual practices to reduce the spread of sexually transmitted infections (STIs).  Use sunscreen. Apply sunscreen liberally and repeatedly throughout the day. You should seek shade when your shadow is shorter than you. Protect yourself by wearing long sleeves, pants, a wide-brimmed hat, and sunglasses year round, whenever you are outdoors.  Tell your health care provider of new moles or changes in moles, especially if there is a change in shape or color. Also tell your provider if a mole is larger than the size of a pencil eraser.  A one-time screening for abdominal aortic aneurysm (AAA) and surgical repair of large AAAs by ultrasound is recommended for men aged 65 75 years who are current or former smokers.  Stay current with your vaccines (immunizations). Document Released: 02/14/2008 Document Revised: 06/08/2013 Document Reviewed: 01/13/2011 ExitCare Patient Information 2014 [employer].   Hypertension As your heart beats, it forces blood through your arteries. This force is your blood pressure. If the pressure is too high, it is called hypertension (HTN) or high blood pressure. HTN is dangerous because you may have it and not know it. High blood pressure may mean that your heart has to work harder to pump blood. Your arteries may be narrow or stiff. The extra work puts you at risk for heart disease, stroke, and other problems.  Blood pressure consists of two numbers, a higher number over a lower, 110/72, for example. It is stated as "110 over 72." The ideal is below 120 for the top number (systolic) and under 80 for the bottom (diastolic). Write down your blood pressure today. You should pay close attention to your blood pressure if you have certain conditions such as:  Heart failure.  Prior heart attack.  Diabetes  Chronic kidney  disease.  Prior stroke.  Multiple risk factors for heart disease. To see if you have HTN, your blood pressure should be measured while you are seated with your arm held at the level of the heart. It should be measured at least twice. A one-time elevated blood pressure reading (especially in the Emergency Department) does not mean that you need treatment. There may be conditions in which the blood pressure is different between your right and left arms. It is important to see your caregiver soon for a recheck. Most people have essential hypertension which means that there is not a specific cause. This type of high blood pressure may be lowered by changing lifestyle factors such as:  Stress.  Smoking.  Lack of exercise.  Excessive weight.  Drug/tobacco/alcohol use.  Eating less salt. Most people do not have symptoms from high blood pressure until it has caused damage to the body. Effective treatment can often prevent, delay or reduce that damage. TREATMENT  When a cause has been identified, treatment for high blood pressure is directed at the cause. There are a large number of medications to treat HTN. These fall into several categories, and your caregiver will help you select the medicines that are best for you. Medications may have side effects. You should review side effects with your caregiver. If your blood pressure stays high after you have made lifestyle changes or started on medicines,   Your medication(s) may need to be changed.  Other problems may need to be addressed.  Be certain you understand your prescriptions, and know how and when to take your medicine.  Be sure to follow up with your caregiver within the time frame advised (usually within two weeks) to have your blood pressure rechecked and to review your medications.  If you are taking more than one medicine to lower your blood pressure, make sure you know how and at what times they should be taken. Taking two medicines  at the same time can result in blood pressure that is too low. SEEK IMMEDIATE MEDICAL CARE IF:  You develop a severe headache, blurred or changing vision, or confusion.  You have unusual weakness or numbness, or a faint feeling.  You have severe chest or abdominal pain, vomiting, or breathing problems. MAKE SURE YOU:   Understand these instructions.  Will watch your condition.  Will get help right away if you are not doing well or get worse. Document Released: 08/18/2005 Document Revised: 11/10/2011 Document Reviewed: 04/07/2008 Exit[employer] Patient Information 2014 [employer].  

## 2013-11-17 ENCOUNTER — Encounter: Payer: Self-pay | Admitting: Internal Medicine

## 2013-11-17 ENCOUNTER — Telehealth: Payer: Self-pay | Admitting: Internal Medicine

## 2013-11-17 NOTE — Assessment & Plan Note (Signed)
Exam done He was given a tdap, he refused a flu vax Labs ordered Pt ed material was given

## 2013-11-17 NOTE — Progress Notes (Signed)
Subjective:    Patient ID: Frank Roy, male    DOB: [birth year], 53 y.o.   MRN: [id number]  Hypertension This is a chronic problem. The current episode started today. The problem is unchanged. The problem is uncontrolled. Pertinent negatives include no anxiety, blurred vision, chest pain, headaches, malaise/fatigue, neck pain, orthopnea, palpitations, peripheral edema, PND, shortness of breath or sweats. Agents associated with hypertension include NSAIDs. Past treatments include nothing. The current treatment provides no improvement. Compliance problems include diet and exercise.       Review of Systems  Constitutional: Negative.  Negative for fever, chills, malaise/fatigue, diaphoresis, appetite change and fatigue.  HENT: Negative.   Eyes: Negative.  Negative for blurred vision.  Respiratory: Negative.  Negative for cough, choking, chest tightness, shortness of breath and stridor.   Cardiovascular: Negative.  Negative for chest pain, palpitations, orthopnea, leg swelling and PND.  Gastrointestinal: Negative.  Negative for nausea, vomiting, abdominal pain, diarrhea, constipation and blood in stool.  Endocrine: Negative.   Genitourinary: Negative.  Negative for dysuria, urgency, frequency, hematuria, flank pain, decreased urine volume, difficulty urinating and testicular pain.  Musculoskeletal: Negative.  Negative for neck pain.  Skin: Negative.   Allergic/Immunologic: Negative.   Neurological: Negative.  Negative for dizziness, tremors, weakness, light-headedness, numbness and headaches.  Hematological: Negative.  Negative for adenopathy. Does not bruise/bleed easily.  Psychiatric/Behavioral: Negative.        Objective:   Physical Exam  Vitals reviewed. Constitutional: He is oriented to person, place, and time. He appears well-developed and well-nourished. No distress.  HENT:  Head: Normocephalic and atraumatic.  Mouth/Throat: Oropharynx is clear and moist. No oropharyngeal  exudate.  Eyes: Conjunctivae are normal. Right eye exhibits no discharge. Left eye exhibits no discharge. No scleral icterus.  Neck: Normal range of motion. Neck supple. No JVD present. No tracheal deviation present. No thyromegaly present.  Cardiovascular: Normal rate, regular rhythm, normal heart sounds and intact distal pulses.  Exam reveals no gallop and no friction rub.   No murmur heard. Pulmonary/Chest: Effort normal and breath sounds normal. No stridor. No respiratory distress. He has no wheezes. He has no rales. He exhibits no tenderness.  Abdominal: Soft. Bowel sounds are normal. He exhibits no distension and no mass. There is no tenderness. There is no rebound and no guarding. Hernia confirmed negative in the right inguinal area and confirmed negative in the left inguinal area.  Genitourinary: Rectum normal, prostate normal, testes normal and penis normal. Rectal exam shows no external hemorrhoid, no internal hemorrhoid, no fissure, no mass, no tenderness and anal tone normal. Guaiac negative stool. Prostate is not enlarged and not tender. Right testis shows no mass, no swelling and no tenderness. Right testis is descended. Left testis shows no mass, no swelling and no tenderness. Left testis is descended. Circumcised. No penile erythema or penile tenderness. No discharge found.  Musculoskeletal: Normal range of motion. He exhibits no edema and no tenderness.       Left knee: He exhibits deformity. He exhibits normal range of motion, no swelling, no effusion and no erythema. No tenderness found.  Left knee looks good post-op Sutures are in place  Lymphadenopathy:    He has no cervical adenopathy.       Right: No inguinal adenopathy present.       Left: No inguinal adenopathy present.  Neurological: He is oriented to person, place, and time.  Skin: Skin is warm and dry. No rash noted. He is not diaphoretic. No erythema. No pallor.  Psychiatric: He has a normal mood and affect. His  behavior is normal. Judgment and thought content normal.     Lab Results  Component Value Date   WBC 9.3 11/16/2013   HGB 14.3 11/16/2013   HCT 42.8 11/16/2013   PLT 294.0 11/16/2013   GLUCOSE 110* 11/16/2013   CHOL 238* 11/16/2013   TRIG 268.0* 11/16/2013   HDL 50.50 11/16/2013   LDLDIRECT 158.5 04/18/2010   LDLCALC 134* 11/16/2013   ALT 24 11/16/2013   AST 21 11/16/2013   NA 139 11/16/2013   K 3.9 11/16/2013   CL 101 11/16/2013   CREATININE 1.1 11/16/2013   BUN 11 11/16/2013   CO2 31 11/16/2013   TSH 1.67 11/16/2013   PSA 1.65 11/16/2013       Assessment & Plan:

## 2013-11-17 NOTE — Assessment & Plan Note (Signed)
I asked him to start an antihypertensive but he is not willing to do that He will work to improve his lifestyle modifications I will check his labs today to screen for end organ damage and secondary causes of HTN

## 2013-11-17 NOTE — Telephone Encounter (Signed)
Relevant patient education mailed to patient.  

## 2013-11-22 ENCOUNTER — Ambulatory Visit (INDEPENDENT_AMBULATORY_CARE_PROVIDER_SITE_OTHER): Payer: BC Managed Care – PPO | Admitting: Internal Medicine

## 2013-11-22 ENCOUNTER — Encounter: Payer: Self-pay | Admitting: Internal Medicine

## 2013-11-22 VITALS — BP 140/80 | HR 88 | Temp 98.0°F | Resp 13 | Wt 203.6 lb

## 2013-11-22 DIAGNOSIS — Z4802 Encounter for removal of sutures: Secondary | ICD-10-CM

## 2013-11-22 DIAGNOSIS — M25462 Effusion, left knee: Secondary | ICD-10-CM

## 2013-11-22 DIAGNOSIS — M25569 Pain in unspecified knee: Secondary | ICD-10-CM

## 2013-11-22 DIAGNOSIS — M25562 Pain in left knee: Secondary | ICD-10-CM

## 2013-11-22 DIAGNOSIS — M25469 Effusion, unspecified knee: Secondary | ICD-10-CM

## 2013-11-22 MED ORDER — CELECOXIB 200 MG PO CAPS: 200.0000 mg | ORAL_CAPSULE | Freq: Two times a day (BID) | ORAL | Status: DC

## 2013-11-22 NOTE — Progress Notes (Signed)
   Subjective:    Patient ID: Frank Roy, male    DOB: 1961/04/29, 53 y.o.   MRN: [id number]  HPI He had L meniscus repair surgery in [place] on 11/08/2013 . He is here for stitches removal from his left knee. 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   Denies fever, chills, sweats. There has been no drainage, erythema. Swelling is getting better from over the weekend.  He rates L knee pain 7/10 after rehab; without activity pain is rated 3/10. He has decreased ROM ofthe affected extremity; using massage and ice therapy for relief.  PMH  includes R meniscus laceration repair on 08/10/2013.    Review of Systems  Cough, dyspnea, sputum production, or hemoptysis are absent.  No associated weakness, numbness/tingling in extremities.  No changes in skin temperature or color in the area of the symptoms.  Rash or skin lesions absent.      Objective:   Physical Exam General appearance is one of good health and nourishment w/o distress.  Left knee: He exhibits decreased range of motion, significant effusion , no ecchymosis, no deformity except swelling.Well healed laceration w/o erythema ,increased temp or purulence. No joint tenderness. No crepitus noted.  Right knee: well-healed scars noted on medial and lateral aspect of patellar; no effusion, erythema, deformity noted. No crepitus noted.  Skin:Warm & dry. Intact without suspicious lesions or rashes   4 sutures removed with some difficulty due to effusion      Assessment & Plan:  #1 laceration removal #2 patellar effusion See orders

## 2013-11-22 NOTE — Progress Notes (Signed)
Pre visit review using our clinic review tool, if applicable. No additional management support is needed unless otherwise documented below in the visit note. 

## 2013-11-22 NOTE — Patient Instructions (Signed)
Please report warning signs as we discussed. Worrisome would be change in color or size, increased pain, fever, or pus production. 

## 2013-11-22 NOTE — Progress Notes (Signed)
   Subjective:    Patient ID: Frank Roy, male    DOB: 09-07-1960, 53 y.o.   MRN: [id number]  HPI  He had L meniscus repair surgery in [place] on 11/08/2013 for stitches removal from his left knee. 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    Previous treatment includes R meniscus laceration repair on 08/10/2013. Denies fever, chills, sweats. There has been no drainage, erythema. Swelling has improved since last week.  He rates L knee pain 7/10 after rehab; without activity pain is rated 3/10. He has isolated difficulty moving the affected extremity; using massage and ice therapy for relief.    Review of Systems No associated fever, chills, sweats, or weight loss are described. Cough, dyspnea, sputum production, or hemoptysis are absent. No associated weakness, numbness/tingling in extremities.  No changes in skin temperature or color in the area of the symptoms. Rash or skin lesions absent. No history of abnormal bruising or bleeding.    Objective:   Physical Exam General appearance is one of good health and nourishment w/o distress. Eyes: No conjunctival inflammation or scleral icterus is present. Oral exam: Dental hygiene is good; lips and gums are healthy appearing.There is no oropharyngeal erythema or exudate noted.  Heart:  Normal rate and regular rhythm. S1 and S2 normal without gallop, murmur, click, rub or other extra sounds   Lungs:Chest clear to auscultation; no wheezes, rhonchi,rales ,or rubs present.No increased work of breathing.  Abdomen: bowel sounds normal, soft and non-tender without masses, organomegaly or hernias noted.  No guarding or rebound . No tenderness over the flanks to percussion Musculoskeletal: Able to lie flat and sit up without help. Gait normal, slight limp on L side.  Left knee: He exhibits normal range of motion; significant effusion, no ecchymosis, no deformity, no laceration, no erythema, normal alignment, no LCL laxity, normal  patellar mobility and no bony tenderness. No tenderness on exam. No crepitus noted.  Right knee: well-healed scars noted on medial and lateral aspect of patellar; no effusion, erythema, deformity noted. No crepitus noted.    Skin:Warm & dry.  Intact without suspicious lesions or rashes ; no jaundice or tenting Lymphatic: No lymphadenopathy is noted about the head, neck, axilla, or inguinal areas.     Assessment & Plan:

## 2013-11-28 ENCOUNTER — Telehealth: Payer: Self-pay | Admitting: *Deleted

## 2013-11-28 ENCOUNTER — Other Ambulatory Visit: Payer: Self-pay | Admitting: Internal Medicine

## 2013-11-28 DIAGNOSIS — M1990 Unspecified osteoarthritis, unspecified site: Secondary | ICD-10-CM

## 2013-11-28 NOTE — Telephone Encounter (Signed)
Refill for celexa sent to [facility] on [address]

## 2013-11-28 NOTE — Telephone Encounter (Signed)
Prior authorization approved from the time period of 11/28/2013 through 11/29/2014. Approval Reference number: [id number]. Approval letter sent for scanning. JG//CMA

## 2013-11-28 NOTE — Telephone Encounter (Signed)
Prior authorization form for Celebrex faxed. Awaiting response. JG//CMA

## 2015-03-19 ENCOUNTER — Encounter: Payer: Self-pay | Admitting: Gastroenterology

## 2015-04-23 ENCOUNTER — Ambulatory Visit (INDEPENDENT_AMBULATORY_CARE_PROVIDER_SITE_OTHER): Payer: Self-pay | Admitting: Physician Assistant

## 2015-04-23 VITALS — BP 128/80 | HR 66 | Temp 99.0°F | Resp 16 | Ht 72.0 in | Wt 202.0 lb

## 2015-04-23 DIAGNOSIS — S91311A Laceration without foreign body, right foot, initial encounter: Secondary | ICD-10-CM

## 2015-04-23 DIAGNOSIS — Z23 Encounter for immunization: Secondary | ICD-10-CM

## 2015-04-23 MED ORDER — DOXYCYCLINE HYCLATE 100 MG PO CAPS: 100.0000 mg | ORAL_CAPSULE | Freq: Two times a day (BID) | ORAL | Status: DC

## 2015-04-23 NOTE — Progress Notes (Signed)
   Subjective:    Patient ID: Frank Roy, male    DOB: [birth year], 54 y.o.   MRN: [id number]  HPI Patient presents for right foot laceration sustained last night when glass table broke and landed on foot. Washed foot and covered with copious amount of liquid bandage, however, laceration repair "busted open" this afternoon. Denies pain, redness, loss of sensation/ROM/fxn, numbness, or weakness. Not sure when he received his last tetanus vaccination. NKDA.   Review of Systems As noted above.    Objective:   Physical Exam  Constitutional: He is oriented to person, place, and time. He appears well-developed and well-nourished. No distress.  Blood pressure 128/80, pulse 66, temperature 99 F (37.2 C), temperature source Oral, resp. rate 16, height 6' (1.829 m), weight 202 lb (91.627 kg), SpO2 99 %.  HENT:  Head: Normocephalic and atraumatic.  Right Ear: External ear normal.  Left Ear: External ear normal.  Eyes: Conjunctivae are normal. Right eye exhibits no discharge. Left eye exhibits no discharge. No scleral icterus.  Pulmonary/Chest: Effort normal.  Musculoskeletal:       Left ankle: Normal.       Left foot: There is laceration. There is normal range of motion, no tenderness, no bony tenderness, no swelling, normal capillary refill, no crepitus and no deformity.       Feet:  Neurological: He is alert and oriented to person, place, and time.  Skin: Skin is warm and dry. No rash noted. He is not diaphoretic. No erythema. No pallor.  Psychiatric: He has a normal mood and affect. His behavior is normal. Judgment and thought content normal.   Procedure Consent obtained. 4 cc 1% lido local anesthesia. Cleaned with soap and water. Liquid bandage removed. Wound explored. Clotted blood removed. No tendon appreciated. #10 5-0 ethilon simple interrupted sutures placed. Clean dressing applied.     Assessment & Plan:  1. Laceration of foot, right, initial encounter RTC 05/02/15 for suture  removal. Care instructions given and discussed. Ibuprofen for pain. Warning signs/red flags discussed. - doxycycline (VIBRAMYCIN) 100 MG capsule; Take 1 capsule (100 mg total) by mouth 2 (two) times daily.  Dispense: 20 capsule; Refill: 0  2. Need for Tdap vaccination - Tdap vaccine greater than or equal to 7yo IM   Janan Ridge PA-C  [facility] 04/23/2015 8:16 PM

## 2015-04-23 NOTE — Patient Instructions (Signed)

## 2015-05-02 ENCOUNTER — Ambulatory Visit (INDEPENDENT_AMBULATORY_CARE_PROVIDER_SITE_OTHER): Payer: Self-pay | Admitting: Physician Assistant

## 2015-05-02 VITALS — BP 138/82 | HR 82 | Temp 97.6°F | Resp 18 | Ht 71.0 in | Wt 200.0 lb

## 2015-05-02 DIAGNOSIS — S91311D Laceration without foreign body, right foot, subsequent encounter: Secondary | ICD-10-CM

## 2015-05-02 DIAGNOSIS — Z4802 Encounter for removal of sutures: Secondary | ICD-10-CM

## 2015-05-02 NOTE — Progress Notes (Signed)
   Subjective:    Patient ID: Frank Roy, male    DOB: 08/30/1961, 54 y.o.   MRN: [id number]  HPI Patient presents for suture removal status post laceration repair 7 days ago. States that foot is sometimes sore if he bumps it, but is not painful. Denies drainage, fever, or erythema. Has kept covered the entire time.    Review of Systems  Constitutional: Negative for fever and chills.  Skin: Positive for wound. Negative for color change.  Neurological: Negative for weakness and numbness.       Objective:   Physical Exam  Constitutional: He is oriented to person, place, and time. He appears well-developed and well-nourished. No distress.  Blood pressure 138/82, pulse 82, temperature 97.6 F (36.4 C), temperature source Oral, resp. rate 18, height  (1.803 m), weight 200 lb (90.719 kg), SpO2 98 %.   HENT:  Head: Normocephalic and atraumatic.  Right Ear: External ear normal.  Left Ear: External ear normal.  Eyes: Conjunctivae are normal. Right eye exhibits no discharge. Left eye exhibits no discharge. No scleral icterus.  Pulmonary/Chest: Effort normal.  Neurological: He is alert and oriented to person, place, and time.  Skin: Skin is warm and dry. No rash noted. He is not diaphoretic. No erythema. No pallor.  Wound intact. Sutures removed. No drainage or erythema.  Psychiatric: He has a normal mood and affect. His behavior is normal. Judgment and thought content normal.       Assessment & Plan:  1. Laceration of foot, right, subsequent encounter Wound healed well. No additional f/u needed.   Janan Ridge PA-C  [facility] 05/02/2015 7:49 PM

## 2015-06-24 NOTE — Progress Notes (Signed)
  Medical screening examination/treatment/procedure(s) were performed by non-physician practitioner and as supervising physician I was immediately available for consultation/collaboration.     

## 2015-11-28 ENCOUNTER — Telehealth: Payer: Self-pay

## 2015-11-28 NOTE — Telephone Encounter (Signed)
LVM for pt to call back as soon as possible.   RE: Flu Vaccine for this flu season.   

## 2015-11-29 NOTE — Telephone Encounter (Signed)
Patient states he has already had the flu for this year and he is not interested in getting the injection.

## 2018-06-03 ENCOUNTER — Encounter (INDEPENDENT_AMBULATORY_CARE_PROVIDER_SITE_OTHER): Payer: Self-pay

## 2018-06-04 ENCOUNTER — Encounter: Payer: Self-pay | Admitting: Family Medicine

## 2018-06-04 ENCOUNTER — Ambulatory Visit (INDEPENDENT_AMBULATORY_CARE_PROVIDER_SITE_OTHER): Payer: PRIVATE HEALTH INSURANCE | Admitting: Family Medicine

## 2018-06-04 ENCOUNTER — Other Ambulatory Visit: Payer: Self-pay | Admitting: Family Medicine

## 2018-06-04 ENCOUNTER — Other Ambulatory Visit (INDEPENDENT_AMBULATORY_CARE_PROVIDER_SITE_OTHER): Payer: PRIVATE HEALTH INSURANCE

## 2018-06-04 ENCOUNTER — Telehealth: Payer: Self-pay | Admitting: Emergency Medicine

## 2018-06-04 VITALS — BP 122/80 | HR 79 | Temp 97.5°F | Ht 71.0 in | Wt 209.8 lb

## 2018-06-04 DIAGNOSIS — Z111 Encounter for screening for respiratory tuberculosis: Secondary | ICD-10-CM | POA: Diagnosis not present

## 2018-06-04 DIAGNOSIS — Z0000 Encounter for general adult medical examination without abnormal findings: Secondary | ICD-10-CM | POA: Diagnosis not present

## 2018-06-04 DIAGNOSIS — Z125 Encounter for screening for malignant neoplasm of prostate: Secondary | ICD-10-CM | POA: Diagnosis not present

## 2018-06-04 DIAGNOSIS — Z13220 Encounter for screening for lipoid disorders: Secondary | ICD-10-CM | POA: Diagnosis not present

## 2018-06-04 DIAGNOSIS — R7309 Other abnormal glucose: Secondary | ICD-10-CM | POA: Diagnosis not present

## 2018-06-04 LAB — COMPREHENSIVE METABOLIC PANEL
ALT: 22 U/L (ref 0–53)
AST: 17 U/L (ref 0–37)
Albumin: 4.1 g/dL (ref 3.5–5.2)
Alkaline Phosphatase: 106 U/L (ref 39–117)
BILIRUBIN TOTAL: 0.4 mg/dL (ref 0.2–1.2)
BUN: 9 mg/dL (ref 6–23)
CO2: 34 mEq/L — ABNORMAL HIGH (ref 19–32)
CREATININE: 1.1 mg/dL (ref 0.40–1.50)
Calcium: 10 mg/dL (ref 8.4–10.5)
Chloride: 102 mEq/L (ref 96–112)
GFR: 88.25 mL/min (ref >60.00)
Glucose, Bld: 160 mg/dL — ABNORMAL HIGH (ref 70–99)
Potassium: 4.2 mEq/L (ref 3.5–5.1)
Sodium: 141 mEq/L (ref 135–145)
Total Protein: 7.3 g/dL (ref 6.0–8.3)

## 2018-06-04 LAB — HEMOGLOBIN A1C: Hgb A1c MFr Bld: 6.7 % — ABNORMAL HIGH (ref 4.6–6.5)

## 2018-06-04 LAB — LIPID PANEL
Cholesterol: 229 mg/dL — ABNORMAL HIGH (ref 0–200)
HDL: 53.5 mg/dL (ref >39.00)
LDL CALC: 144 mg/dL — AB (ref 0–99)
NonHDL: 175.51
Total CHOL/HDL Ratio: 4
Triglycerides: 159 mg/dL — ABNORMAL HIGH (ref 0.0–149.0)
VLDL: 31.8 mg/dL (ref 0.0–40.0)

## 2018-06-04 LAB — CBC
HCT: 41 % (ref 39.0–52.0)
Hemoglobin: 13.4 g/dL (ref 13.0–17.0)
MCHC: 32.7 g/dL (ref 30.0–36.0)
MCV: 86.3 fl (ref 78.0–100.0)
Platelets: 241 10*3/uL (ref 150.0–400.0)
RBC: 4.75 Mil/uL (ref 4.22–5.81)
RDW: 13.7 % (ref 11.5–15.5)
WBC: 5.9 10*3/uL (ref 4.0–10.5)

## 2018-06-04 LAB — PSA: PSA: 2.46 ng/mL (ref 0.10–4.00)

## 2018-06-04 LAB — TSH: TSH: 2.54 u[IU]/mL (ref 0.35–4.50)

## 2018-06-04 MED ORDER — CLINDAMYCIN PHOS-BENZOYL PEROX 1-5 % EX GEL: Freq: Two times a day (BID) | CUTANEOUS | 0 refills | Status: DC

## 2018-06-04 NOTE — Progress Notes (Signed)
Frank Roy - 57 y.o. male MRN [id number]  Date of birth: 03/29/1961  Subjective Chief Complaint  Patient presents with  . Annual Exam    HPI Frank Roy is a 57 y.o. male here today to establish care and for annual exam.  He reports that he has been pretty healthy.  He does not take any regular medications.  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  He also works as a Firefighter and remains very active.  He follows a mediterranean type diet.  He is interested in prostate cancer screening today.  Only other concern is occasional "whooshing" noise in his ear.  He denies hearing loss.   Review of Systems  Constitutional: Negative for chills, fever, malaise/fatigue and weight loss.  HENT: Negative for congestion, ear pain and sore throat.   Eyes: Negative for blurred vision, double vision and pain.  Respiratory: Negative for cough and shortness of breath.   Cardiovascular: Negative for chest pain and palpitations.  Gastrointestinal: Negative for abdominal pain, blood in stool, constipation, heartburn and nausea.  Genitourinary: Negative for dysuria and urgency.  Musculoskeletal: Negative for joint pain and myalgias.  Neurological: Negative for dizziness and headaches.  Endo/Heme/Allergies: Does not bruise/bleed easily.  Psychiatric/Behavioral: Negative for depression. The patient is not nervous/anxious and does not have insomnia.     No Known Allergies  Past Medical History:  Diagnosis Date  . Asthma   . GERD (gastroesophageal reflux disease)     History reviewed. No pertinent surgical history.  Social History   Socioeconomic History  . Marital status: Married    Spouse name: Not on file  . Number of children: Not on file  . Years of education: Not on file  . Highest education level: Not on file  Occupational History  . Not on file  Social Needs  . Financial resource strain: Not on file  . Food insecurity:   Worry: Not on file    Inability: Not on file  . Transportation needs:    Medical: Not on file    Non-medical: Not on file  Tobacco Use  . Smoking status: Never Smoker  . Smokeless tobacco: Never Used  . Tobacco comment: Regular Exercise-Yes  Substance and Sexual Activity  . Alcohol use: No  . Drug use: No  . Sexual activity: Yes  Lifestyle  . Physical activity:    Days per week: Not on file    Minutes per session: Not on file  . Stress: Not on file  Relationships  . Social connections:    Talks on phone: Not on file    Gets together: Not on file    Attends religious service: Not on file    Active member of club or organization: Not on file    Attends meetings of clubs or organizations: Not on file    Relationship status: Not on file  Other Topics Concern  . Not on file  Social History Narrative  . Not on file    Family History  Problem Relation Age of Onset  . Hypertension Father   . Stroke Neg Hx   . Alcohol abuse Neg Hx   . Cancer Neg Hx   . COPD Neg Hx   . Depression Neg Hx   . Diabetes Neg Hx   . Drug abuse Neg Hx   . Early death Neg Hx   . Hearing loss Neg Hx   . Heart disease Neg  Hx   . Hyperlipidemia Neg Hx   . Kidney disease Neg Hx     Health Maintenance  Topic Date Due  . Hepatitis C Screening  Feb 13, 1961  . HIV Screening  06/05/2019 (Originally 11/11/1974)  . INFLUENZA VACCINE  06/16/2019 (Originally 04/01/2018)  . COLONOSCOPY  06/13/2020  . TETANUS/TDAP  04/22/2025    ----------------------------------------------------------------------------------------------------------------------------------------------------------------------------------------------------------------- Physical Exam BP 122/80   Pulse 79   Temp (!) 97.5 F (36.4 C) (Oral)   Ht '5\' 11"'  (1.803 m)   Wt 209 lb 12.8 oz (95.2 kg)   SpO2 98%   BMI 29.26 kg/m   Physical Exam  Constitutional: He is oriented to person, place, and time. He appears well-nourished. No distress.    HENT:  Head: Normocephalic and atraumatic.  Right Ear: External ear normal.  Left Ear: External ear normal.  Mouth/Throat: Oropharynx is clear and moist.  Two small hairs noted on R TM  Eyes: No scleral icterus.  Neck: Normal range of motion. No thyromegaly present.  Cardiovascular: Normal rate, regular rhythm, normal heart sounds and intact distal pulses.  Pulmonary/Chest: Effort normal and breath sounds normal.  Abdominal: Soft. Bowel sounds are normal. He exhibits no distension. There is no tenderness. There is no guarding.  Genitourinary: Rectum normal and prostate normal.  Musculoskeletal: He exhibits no edema.  Lymphadenopathy:    He has no cervical adenopathy.  Neurological: He is alert and oriented to person, place, and time. No cranial nerve deficit. He exhibits normal muscle tone.  Skin: Skin is warm and dry. No rash noted.  Psychiatric: He has a normal mood and affect. His behavior is normal.    ------------------------------------------------------------------------------------------------------------------------------------------------------------------------------------------------------------------- Assessment and Plan  Well adult exam Well adult Orders Placed This Encounter  Procedures  . Comp Met (CMET)  . CBC  . Lipid Profile  . TSH  . PSA  . QuantiFERON-TB Gold Plus  Immunizations: declines flu vaccine Screenings: PSA, lipid Anticipatory guidance/Risk factor reduction: Per AVS

## 2018-06-04 NOTE — Assessment & Plan Note (Signed)
Well adult Orders Placed This Encounter  Procedures  . Comp Met (CMET)  . CBC  . Lipid Profile  . TSH  . PSA  . QuantiFERON-TB Gold Plus  Immunizations: declines flu vaccine Screenings: PSA, lipid Anticipatory guidance/Risk factor reduction: Per AVS

## 2018-06-04 NOTE — Progress Notes (Signed)
Frank Roy - 57 y.o. male MRN [id number]  Date of birth: 17-Oct-1960  Subjective Chief Complaint  Patient presents with  . Annual Exam    HPI 57 y/o male presents today to establish care. He reports regular dental exams every 6 months and annual eye exams. Patient requests TB screening for new job application. Additionally, He reports a whistling or "whooshing" sound to R ear. Denies hearing loss or difficulty.   He had been previously diagnosed with HTN but was never put on antihypertensives. He reports having increased stress at the time and has since reduced his stress level.   No Known Allergies  Past Medical History:  Diagnosis Date  . Asthma   . GERD (gastroesophageal reflux disease)     History reviewed. No pertinent surgical history.  Social History   Socioeconomic History  . Marital status: Married    Spouse name: Not on file  . Number of children: Not on file  . Years of education: Not on file  . Highest education level: Not on file  Occupational History  . Not on file  Social Needs  . Financial resource strain: Not on file  . Food insecurity:    Worry: Not on file    Inability: Not on file  . Transportation needs:    Medical: Not on file    Non-medical: Not on file  Tobacco Use  . Smoking status: Never Smoker  . Smokeless tobacco: Never Used  . Tobacco comment: Regular Exercise-Yes  Substance and Sexual Activity  . Alcohol use: No  . Drug use: No  . Sexual activity: Yes  Lifestyle  . Physical activity:    Days per week: Not on file    Minutes per session: Not on file  . Stress: Not on file  Relationships  . Social connections:    Talks on phone: Not on file    Gets together: Not on file    Attends religious service: Not on file    Active member of club or organization: Not on file    Attends meetings of clubs or organizations: Not on file    Relationship status: Not on file  Other Topics Concern  . Not on file  Social History Narrative  .  Not on file    Family History  Problem Relation Age of Onset  . Hypertension Father   . Stroke Neg Hx   . Alcohol abuse Neg Hx   . Cancer Neg Hx   . COPD Neg Hx   . Depression Neg Hx   . Diabetes Neg Hx   . Drug abuse Neg Hx   . Early death Neg Hx   . Hearing loss Neg Hx   . Heart disease Neg Hx   . Hyperlipidemia Neg Hx   . Kidney disease Neg Hx     Health Maintenance  Topic Date Due  . Hepatitis C Screening  [birth year]15[birth year]1960  . HIV Screening  06/05/2019 (Originally 11/11/1974)  . INFLUENZA VACCINE  06/16/2019 (Originally 04/01/2018)  . COLONOSCOPY  06/13/2020  . 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  -Drug Use: Patient denies street/IV/recreational drug use -Tobacco Use: Patient denies any tobacco use.  -Caffeine: Patient denies regular caffeine intake.  -Diet: Patient reports having either a smoothie or protein shake for breakfast, usually a sandwich for lunch, and either chicken or fish with vegetables for dinner.  -Exercise: Patient  reports working out 2-3x/ week  ROS -General: Patient reports adequate energy level and a regular sleeping pattern.  -HEENT: Patient denies vision changes. Patient denies nasal congestion, sore throat, or difficulty swallowing.  -Cardiac: Patient denies chest pain, pressure, or palpitations. Patient denies syncope or dizziness.  -Pulmonary: Patient denies shortness or breath, dyspnea, or cough.  -GI: Patient report occasional reflux symptoms that is managed by tums. Patient denies N/V/D. Patient reports regular daily bowel movements.  -GU: Patient denies frequency, urgency, or change in urine.  ----------------------------------------------------------------------------------------------------------------------------------------------------------------------------------------------------------------- Physical  Exam BP 122/80   Pulse 79   Temp (!) 97.5 F (36.4 C) (Oral)   Ht 5\' 11"  (1.803 m)   Wt 209 lb 12.8 oz (95.2 kg)   SpO2 98%   BMI 29.26 kg/m   Physical Exam  Constitutional: He appears well-developed and well-nourished.  HENT:  Right Ear: External ear normal.  Left Ear: External ear normal.  Mouth/Throat: Oropharynx is clear and moist. No oropharyngeal exudate.  Two small hairs noted on R TM.   Eyes: Conjunctivae are normal. No scleral icterus.  Neck: No thyromegaly present.  Cardiovascular: Normal rate, regular rhythm and normal heart sounds.  Pulmonary/Chest: Effort normal and breath sounds normal.  Abdominal: Soft. Bowel sounds are normal.  Genitourinary: Rectum normal and prostate normal.  Musculoskeletal: He exhibits no edema.  Lymphadenopathy:    He has no cervical adenopathy.  Neurological: He is alert.  Skin: Skin is warm and dry. Capillary refill takes less than 2 seconds.  Psychiatric: He has a normal mood and affect. His behavior is normal.    ------------------------------------------------------------------------------------------------------------------------------------------------------------------------------------------------------------------- Assessment and Plan  Wellness Exam -CMP, CBC, Lipid, TSH, PSA,  -Patient denies influenza vaccine.  Teaching: -Continue healthy diet and physical exercise routine.   TB screening -quantiferon- TB gold

## 2018-06-04 NOTE — Telephone Encounter (Signed)
Called patient pharmacy to cancel prescription that was sent in. (Banzaclin)

## 2018-06-04 NOTE — Patient Instructions (Signed)

## 2018-06-06 LAB — QUANTIFERON-TB GOLD PLUS
NIL: 0.02 IU/mL
QuantiFERON-TB Gold Plus: NEGATIVE
TB1-NIL: 0.01 IU/mL
TB2-NIL: 0.03 [IU]/mL

## 2018-06-08 NOTE — Progress Notes (Signed)
-  TB test is negative, form is ready for pick-up -Glucose is elevated with A1c of 6.7% indicating he has diabetes.  Please have him schedule a f/u to review treatment and monitoring of this.  -Cholesterol is also high, we'll discuss this further at his f/u appt.  -PSA and other labs are normal.

## 2018-06-15 ENCOUNTER — Ambulatory Visit (INDEPENDENT_AMBULATORY_CARE_PROVIDER_SITE_OTHER): Payer: PRIVATE HEALTH INSURANCE | Admitting: Family Medicine

## 2018-06-15 ENCOUNTER — Encounter: Payer: Self-pay | Admitting: Family Medicine

## 2018-06-15 DIAGNOSIS — E1169 Type 2 diabetes mellitus with other specified complication: Secondary | ICD-10-CM

## 2018-06-15 DIAGNOSIS — E119 Type 2 diabetes mellitus without complications: Secondary | ICD-10-CM

## 2018-06-15 DIAGNOSIS — E785 Hyperlipidemia, unspecified: Secondary | ICD-10-CM

## 2018-06-15 HISTORY — DX: Hyperlipidemia, unspecified: E78.5

## 2018-06-15 HISTORY — DX: Type 2 diabetes mellitus without complications: E11.9

## 2018-06-15 HISTORY — DX: Type 2 diabetes mellitus with other specified complication: E11.69

## 2018-06-15 MED ORDER — ATORVASTATIN CALCIUM 20 MG PO TABS: 20.0000 mg | ORAL_TABLET | Freq: Every day | ORAL | 3 refills | Status: DC

## 2018-06-15 NOTE — Assessment & Plan Note (Addendum)
-  New diagnosis -Discussed increased CVD risk with elevated lipids and diabetes. -Ok with starting atorvastatin, rx sent in.

## 2018-06-15 NOTE — Progress Notes (Signed)
Rollie Hynek - 57 y.o. male MRN [id number]  Date of birth: Nov 10, 1960  Subjective Chief Complaint  Patient presents with  . Follow-up  . Diabetes    HPI Frank Roy is a 57 y.o. male here today to follow up on abnormal lab work.    -T2DM:  Recent labs with A1c of 6.7%.  No prior diagnosis of diabetes.  He has already started making changes to his diet to help with blood sugar control.  He reports eating quite a bit of sweets including gummies, cakes and juices/sodas, but has cut back on these after finding out he has diabetes.  He is fairly active as a Product manager.  He denies polyuria/polydipsia.  He has checked out ADA website and is using some ideas and recipes from there.   -HLD:  Elevated cholesterol on recent lipid panel with elevated LDL.  He is open to trying statin in addition to diet and exercise changes.    ROS:  A comprehensive ROS was completed and negative except as noted per HPI  No Known Allergies  Past Medical History:  Diagnosis Date  . Asthma   . GERD (gastroesophageal reflux disease)     No past surgical history on file.  Social History   Socioeconomic History  . Marital status: Married    Spouse name: Not on file  . Number of children: Not on file  . Years of education: Not on file  . Highest education level: Not on file  Occupational History  . Not on file  Social Needs  . Financial resource strain: Not on file  . Food insecurity:    Worry: Not on file    Inability: Not on file  . Transportation needs:    Medical: Not on file    Non-medical: Not on file  Tobacco Use  . Smoking status: Never Smoker  . Smokeless tobacco: Never Used  . Tobacco comment: Regular Exercise-Yes  Substance and Sexual Activity  . Alcohol use: No  . Drug use: No  . Sexual activity: Yes  Lifestyle  . Physical activity:    Days per week: Not on file    Minutes per session: Not on file  . Stress: Not on file  Relationships  . Social connections:    Talks on  phone: Not on file    Gets together: Not on file    Attends religious service: Not on file    Active member of club or organization: Not on file    Attends meetings of clubs or organizations: Not on file    Relationship status: Not on file  Other Topics Concern  . Not on file  Social History Narrative  . Not on file    Family History  Problem Relation Age of Onset  . Hypertension Father   . Stroke Neg Hx   . Alcohol abuse Neg Hx   . Cancer Neg Hx   . COPD Neg Hx   . Depression Neg Hx   . Diabetes Neg Hx   . Drug abuse Neg Hx   . Early death Neg Hx   . Hearing loss Neg Hx   . Heart disease Neg Hx   . Hyperlipidemia Neg Hx   . Kidney disease Neg Hx     Health Maintenance  Topic Date Due  . Hepatitis C Screening  May 23, 1960  . FOOT EXAM  11/10/1969  . OPHTHALMOLOGY EXAM  11/10/1969  . HIV Screening  06/05/2019 (Originally 11/11/1974)  . INFLUENZA VACCINE  06/16/2019 (Originally  04/01/2018)  . HEMOGLOBIN A1C  12/04/2018  . COLONOSCOPY  06/13/2020  . TETANUS/TDAP  04/22/2025  . PNEUMOCOCCAL POLYSACCHARIDE VACCINE AGE 87-64 HIGH RISK  Completed    ----------------------------------------------------------------------------------------------------------------------------------------------------------------------------------------------------------------- Physical Exam BP 110/84   Pulse 91   Temp 97.6 F (36.4 C) (Oral)   Ht 5\' 11"  (1.803 m)   Wt 207 lb (93.9 kg)   SpO2 97%   BMI 28.87 kg/m   Physical Exam  Constitutional: He is oriented to person, place, and time. He appears well-nourished. No distress.  HENT:  Head: Normocephalic and atraumatic.  Mouth/Throat: Oropharynx is clear and moist.  Eyes: No scleral icterus.  Neck: Neck supple. No thyromegaly present.  Cardiovascular: Normal rate, regular rhythm and normal heart sounds.  Pulmonary/Chest: Effort normal and breath sounds normal.  Neurological: He is alert and oriented to person, place, and time.  Skin:  Skin is warm and dry.  Psychiatric: He has a normal mood and affect. His behavior is normal.    ------------------------------------------------------------------------------------------------------------------------------------------------------------------------------------------------------------------- Assessment and Plan  Type 2 diabetes mellitus without complications (HCC) -New diagnosis -Reviewed diagnosis of diabetes and how A1c correlates to blood sugars.  -Reviewed diet and exercise recommendations -He would like to hold off on medication to see if he can control this with lifestyle change.  -I will see him back in about 3 months.    Hyperlipidemia associated with type 2 diabetes mellitus (HCC) -New diagnosis -Discussed increased CVD risk with elevated lipids and diabetes. -Ok with starting atorvastatin, rx sent in.

## 2018-06-15 NOTE — Assessment & Plan Note (Addendum)
-  New diagnosis -Reviewed diagnosis of diabetes and how A1c correlates to blood sugars.  -Reviewed diet and exercise recommendations -He would like to hold off on medication to see if he can control this with lifestyle change.  -I will see him back in about 3 months.

## 2018-06-15 NOTE — Patient Instructions (Signed)
Diabetes Mellitus and Standards of Medical Care Managing diabetes (diabetes mellitus) can be complicated. Your diabetes treatment may be managed by a team of health care providers, including:  A diet and nutrition specialist (registered dietitian).  A nurse.  A certified diabetes educator (CDE).  A diabetes specialist (endocrinologist).  An eye doctor.  A primary care provider.  A dentist.  Your health care providers follow a schedule in order to help you get the best quality of care. The following schedule is a general guideline for your diabetes management plan. Your health care providers may also give you more specific instructions. HbA1c ( hemoglobin A1c) test This test provides information about blood sugar (glucose) control over the previous 2-3 months. It is used to check whether your diabetes management plan needs to be adjusted.  If you are meeting your treatment goals, this test is done at least 2 times a year.  If you are not meeting treatment goals or if your treatment goals have changed, this test is done 4 times a year.  Blood pressure test  This test is done at every routine medical visit. For most people, the goal is less than 130/80. Ask your health care provider what your goal blood pressure should be. Dental and eye exams  Visit your dentist two times a year.  If you have type 1 diabetes, get an eye exam 3-5 years after you are diagnosed, and then once a year after your first exam. ? If you were diagnosed with type 1 diabetes as a child, get an eye exam when you are age 16 or older and have had diabetes for 3-5 years. After the first exam, you should get an eye exam once a year.  If you have type 2 diabetes, have an eye exam as soon as you are diagnosed, and then once a year after your first exam. Foot care exam  Visual foot exams are done at every routine medical visit. The exams check for cuts, bruises, redness, blisters, sores, or other problems with the  feet.  A complete foot exam is done by your health care provider once a year. This exam includes an inspection of the structure and skin of your feet, and a check of the pulses and sensation in your feet. ? Type 1 diabetes: Get your first exam 3-5 years after diagnosis. ? Type 2 diabetes: Get your first exam as soon as you are diagnosed.  Check your feet every day for cuts, bruises, redness, blisters, or sores. If you have any of these or other problems that are not healing, contact your health care provider. Kidney function test ( urine microalbumin)  This test is done once a year. ? Type 1 diabetes: Get your first test 5 years after diagnosis. ? Type 2 diabetes: Get your first test as soon as you are diagnosed.  If you have chronic kidney disease (CKD), get a serum creatinine and estimated glomerular filtration rate (eGFR) test once a year. Lipid profile (cholesterol, HDL, LDL, triglycerides)  This test should be done when you are diagnosed with diabetes, and every 5 years after the first test. If you are on medicines to lower your cholesterol, you may need to get this test done every year. ? The goal for LDL is less than 100 mg/dL (5.5 mmol/L). If you are at high risk, the goal is less than 70 mg/dL (3.9 mmol/L). ? The goal for HDL is 40 mg/dL (2.2 mmol/L) for men and 50 mg/dL(2.8 mmol/L) for women. An HDL  cholesterol of 60 mg/dL (3.3 mmol/L) or higher gives some protection against heart disease. ? The goal for triglycerides is less than 150 mg/dL (8.3 mmol/L). Immunizations  The yearly flu (influenza) vaccine is recommended for everyone 6 months or older who has diabetes.  The pneumonia (pneumococcal) vaccine is recommended for everyone 2 years or older who has diabetes. If you are 97 or older, you may get the pneumonia vaccine as a series of two separate shots.  The hepatitis B vaccine is recommended for adults shortly after they have been diagnosed with diabetes.  The Tdap  (tetanus, diphtheria, and pertussis) vaccine should be given: ? According to normal childhood vaccination schedules, for children. ? Every 10 years, for adults who have diabetes.  The shingles vaccine is recommended for people who have had chicken pox and are 50 years or older. Mental and emotional health  Screening for symptoms of eating disorders, anxiety, and depression is recommended at the time of diagnosis and afterward as needed. If your screening shows that you have symptoms (you have a positive screening result), you may need further evaluation and be referred to a mental health care provider. Diabetes self-management education  Education about how to manage your diabetes is recommended at diagnosis and ongoing as needed. Treatment plan  Your treatment plan will be reviewed at every medical visit. Summary  Managing diabetes (diabetes mellitus) can be complicated. Your diabetes treatment may be managed by a team of health care providers.  Your health care providers follow a schedule in order to help you get the best quality of care.  Standards of care including having regular physical exams, blood tests, blood pressure monitoring, immunizations, screening tests, and education about how to manage your diabetes.  Your health care providers may also give you more specific instructions based on your individual health. This information is not intended to replace advice given to you by your health care provider. Make sure you discuss any questions you have with your health care provider. Document Released: 06/15/2009 Document Revised: 05/16/2016 Document Reviewed: 05/16/2016 Elsevier Interactive Patient Education  [employer].

## 2018-09-15 ENCOUNTER — Ambulatory Visit: Payer: PRIVATE HEALTH INSURANCE | Admitting: Family Medicine

## 2018-10-11 ENCOUNTER — Ambulatory Visit: Payer: PRIVATE HEALTH INSURANCE | Admitting: Family Medicine

## 2018-10-11 DIAGNOSIS — Z0289 Encounter for other administrative examinations: Secondary | ICD-10-CM

## 2018-11-03 ENCOUNTER — Encounter: Payer: Self-pay | Admitting: Family Medicine

## 2019-07-31 ENCOUNTER — Encounter (HOSPITAL_BASED_OUTPATIENT_CLINIC_OR_DEPARTMENT_OTHER): Payer: Self-pay | Admitting: Emergency Medicine

## 2019-07-31 ENCOUNTER — Other Ambulatory Visit: Payer: Self-pay

## 2019-07-31 ENCOUNTER — Emergency Department (HOSPITAL_BASED_OUTPATIENT_CLINIC_OR_DEPARTMENT_OTHER)
Admission: EM | Admit: 2019-07-31 | Discharge: 2019-07-31 | Disposition: A | Discharge status: Home / Self Care | Payer: PRIVATE HEALTH INSURANCE | Attending: Emergency Medicine | Admitting: Emergency Medicine

## 2019-07-31 DIAGNOSIS — M545 Low back pain, unspecified: Secondary | ICD-10-CM

## 2019-07-31 DIAGNOSIS — J45909 Unspecified asthma, uncomplicated: Secondary | ICD-10-CM | POA: Insufficient documentation

## 2019-07-31 DIAGNOSIS — Z79899 Other long term (current) drug therapy: Secondary | ICD-10-CM | POA: Insufficient documentation

## 2019-07-31 DIAGNOSIS — R739 Hyperglycemia, unspecified: Secondary | ICD-10-CM

## 2019-07-31 DIAGNOSIS — Z7984 Long term (current) use of oral hypoglycemic drugs: Secondary | ICD-10-CM | POA: Insufficient documentation

## 2019-07-31 DIAGNOSIS — E1165 Type 2 diabetes mellitus with hyperglycemia: Secondary | ICD-10-CM | POA: Insufficient documentation

## 2019-07-31 LAB — CBC WITH DIFFERENTIAL/PLATELET
Abs Immature Granulocytes: 0.01 10*3/uL (ref 0.00–0.07)
Basophils Absolute: 0 10*3/uL (ref 0.0–0.1)
Basophils Relative: 0 %
Eosinophils Absolute: 0.3 10*3/uL (ref 0.0–0.5)
Eosinophils Relative: 4 %
HCT: 42.4 % (ref 39.0–52.0)
Hemoglobin: 13.9 g/dL (ref 13.0–17.0)
Immature Granulocytes: 0 %
Lymphocytes Relative: 33 %
Lymphs Abs: 2.3 10*3/uL (ref 0.7–4.0)
MCH: 28.3 pg (ref 26.0–34.0)
MCHC: 32.8 g/dL (ref 30.0–36.0)
MCV: 86.4 fL (ref 80.0–100.0)
Monocytes Absolute: 0.6 10*3/uL (ref 0.1–1.0)
Monocytes Relative: 8 %
Neutro Abs: 3.8 10*3/uL (ref 1.7–7.7)
Neutrophils Relative %: 55 %
Platelets: 231 10*3/uL (ref 150–400)
RBC: 4.91 MIL/uL (ref 4.22–5.81)
RDW: 12.2 % (ref 11.5–15.5)
WBC: 7.1 10*3/uL (ref 4.0–10.5)
nRBC: 0 % (ref 0.0–0.2)

## 2019-07-31 LAB — BASIC METABOLIC PANEL
Anion gap: 9 (ref 5–15)
BUN: 15 mg/dL (ref 6–20)
CO2: 25 mmol/L (ref 22–32)
Calcium: 9.1 mg/dL (ref 8.9–10.3)
Chloride: 103 mmol/L (ref 98–111)
Creatinine, Ser: 1.09 mg/dL (ref 0.61–1.24)
GFR calc Af Amer: >60 mL/min (ref >60)
GFR calc non Af Amer: >60 mL/min (ref >60)
Glucose, Bld: 253 mg/dL — ABNORMAL HIGH (ref 70–99)
Potassium: 4 mmol/L (ref 3.5–5.1)
Sodium: 137 mmol/L (ref 135–145)

## 2019-07-31 LAB — POCT I-STAT EG7
Acid-Base Excess: 2 mmol/L (ref 0.0–2.0)
Bicarbonate: 28.6 mmol/L — ABNORMAL HIGH (ref 20.0–28.0)
Calcium, Ion: 1.3 mmol/L (ref 1.15–1.40)
HCT: 42 % (ref 39.0–52.0)
Hemoglobin: 14.3 g/dL (ref 13.0–17.0)
O2 Saturation: 28 %
Patient temperature: 98.3
Potassium: 3.7 mmol/L (ref 3.5–5.1)
Sodium: 134 mmol/L — ABNORMAL LOW (ref 135–145)
TCO2: 30 mmol/L (ref 22–32)
pCO2, Ven: 53.1 mmHg (ref 44.0–60.0)
pH, Ven: 7.339 (ref 7.250–7.430)
pO2, Ven: 20 mmHg — CL (ref 32.0–45.0)

## 2019-07-31 LAB — URINALYSIS, ROUTINE W REFLEX MICROSCOPIC
Bilirubin Urine: NEGATIVE
Glucose, UA: >=500 mg/dL — AB
Hgb urine dipstick: NEGATIVE
Ketones, ur: 15 mg/dL — AB
Leukocytes,Ua: NEGATIVE
Nitrite: NEGATIVE
Protein, ur: NEGATIVE mg/dL
Specific Gravity, Urine: 1.025 (ref 1.005–1.030)
pH: 5.5 (ref 5.0–8.0)

## 2019-07-31 LAB — CBG MONITORING, ED
Glucose-Capillary: 267 mg/dL — ABNORMAL HIGH (ref 70–99)
Glucose-Capillary: 197 mg/dL — ABNORMAL HIGH (ref 70–99)

## 2019-07-31 LAB — URINALYSIS, MICROSCOPIC (REFLEX)

## 2019-07-31 MED ORDER — SODIUM CHLORIDE 0.9 % IV BOLUS
1000.0000 mL | Freq: Once | INTRAVENOUS | Status: AC
  Administered 2019-07-31: 1000 mL via INTRAVENOUS

## 2019-07-31 MED ORDER — METHOCARBAMOL 500 MG PO TABS: 500.0000 mg | ORAL_TABLET | Freq: Two times a day (BID) | ORAL | 0 refills | Status: DC

## 2019-07-31 MED ORDER — METFORMIN HCL 500 MG PO TABS: 500.0000 mg | ORAL_TABLET | Freq: Two times a day (BID) | ORAL | 0 refills | Status: DC

## 2019-07-31 NOTE — ED Notes (Signed)
ED Provider at bedside. 

## 2019-07-31 NOTE — ED Notes (Signed)
Family at bedside. 

## 2019-07-31 NOTE — ED Triage Notes (Signed)
Patient states that he has had polydipsea and polyuria since oct. The patient went to urgent care and found that his blood sugar was elevated and ketones in his urine

## 2019-07-31 NOTE — ED Provider Notes (Signed)
[facility] EMERGENCY DEPARTMENT Provider Note   CSN: [id number] Arrival date & time: 07/31/19  1318     History   Chief Complaint Chief Complaint  Patient presents with  . Hyperglycemia    HPI Frank Roy is a 58 y.o. male with history of asthma, GERD, previously diet-controlled type 2 diabetes who presents with polydipsia, polyuria for the past week or 2.  He went to urgent care today because he has had some left-sided low back pain and had ketones in his urine.  His pain is worse with movement.  He has no history of kidney stones.  He denies any dysuria, abdominal pain, nausea, vomiting.  Per chart review, patient was diagnosed at his PCP visit on 06/16/2019, however they decided to hold off on treatment at this time and focus on diet and exercise.  Patient reports he is pretty active and has been trying to do better with his diet.  Patient previously did not have any symptoms.     HPI  Past Medical History:  Diagnosis Date  . Asthma   . GERD (gastroesophageal reflux disease)     Patient Active Problem List   Diagnosis Date Noted  . Type 2 diabetes mellitus without complications (HCC) 06/15/2018  . Hyperlipidemia associated with type 2 diabetes mellitus (HCC) 06/15/2018  . Well adult exam 11/16/2013  . Allergic rhinitis due to other allergen 03/23/2009  . GERD 03/23/2009    History reviewed. No pertinent surgical history.      Home Medications    Prior to Admission medications   Medication Sig Start Date End Date Taking? Authorizing Provider  atorvastatin (LIPITOR) 20 MG tablet Take 1 tablet (20 mg total) by mouth daily. 06/15/18   Everrett CoombeMatthews, Cody, DO  metFORMIN (GLUCOPHAGE) 500 MG tablet Take 1 tablet (500 mg total) by mouth 2 (two) times daily. 07/31/19   Dalal Livengood, Waylan BogaAlexandra M, PA-C  methocarbamol (ROBAXIN) 500 MG tablet Take 1 tablet (500 mg total) by mouth 2 (two) times daily. 07/31/19   Emi HolesLaw, Len Kluver M, PA-C    Family History Family History   Problem Relation Age of Onset  . Hypertension Father   . Stroke Neg Hx   . Alcohol abuse Neg Hx   . Cancer Neg Hx   . COPD Neg Hx   . Depression Neg Hx   . Diabetes Neg Hx   . Drug abuse Neg Hx   . Early death Neg Hx   . Hearing loss Neg Hx   . Heart disease Neg Hx   . Hyperlipidemia Neg Hx   . Kidney disease Neg Hx     Social History Social History   Tobacco Use  . Smoking status: Never Smoker  . Smokeless tobacco: Never Used  . Tobacco comment: Regular Exercise-Yes  Substance Use Topics  . Alcohol use: No  . Drug use: No     Allergies   Patient has no known allergies.   Review of Systems Review of Systems  Constitutional: Negative for chills and fever.  HENT: Negative for facial swelling and sore throat.   Respiratory: Negative for shortness of breath.   Cardiovascular: Negative for chest pain.  Gastrointestinal: Negative for abdominal pain, nausea and vomiting.  Endocrine: Positive for polydipsia and polyuria.  Genitourinary: Positive for flank pain and frequency. Negative for dysuria.  Musculoskeletal: Negative for back pain.  Skin: Negative for rash and wound.  Neurological: Negative for headaches.  Psychiatric/Behavioral: The patient is not nervous/anxious.      Physical Exam Updated  Vital Signs BP 128/82 (BP Location: Left Arm)   Pulse 68   Temp 98.3 F (36.8 C) (Oral)   Resp 18   Ht 5\' 11"  (1.803 m)   Wt 91.2 kg   SpO2 100%   BMI 28.03 kg/m   Physical Exam Vitals signs and nursing note reviewed.  Constitutional:      General: He is not in acute distress.    Appearance: Normal appearance. He is well-developed. He is not ill-appearing, toxic-appearing or diaphoretic.  HENT:     Head: Normocephalic and atraumatic.     Mouth/Throat:     Pharynx: No oropharyngeal exudate.  Eyes:     General: No scleral icterus.       Right eye: No discharge.        Left eye: No discharge.     Conjunctiva/sclera: Conjunctivae normal.     Pupils: Pupils  are equal, round, and reactive to light.  Neck:     Musculoskeletal: Normal range of motion and neck supple.     Thyroid: No thyromegaly.  Cardiovascular:     Rate and Rhythm: Normal rate and regular rhythm.     Heart sounds: Normal heart sounds. No murmur. No friction rub. No gallop.   Pulmonary:     Effort: Pulmonary effort is normal. No respiratory distress.     Breath sounds: Normal breath sounds. No stridor. No wheezing or rales.  Abdominal:     General: Bowel sounds are normal. There is no distension.     Palpations: Abdomen is soft.     Tenderness: There is no abdominal tenderness. There is no right CVA tenderness, left CVA tenderness, guarding or rebound.  Musculoskeletal:       Back:     Comments: No midline thoracic or lumbar tenderness Some tenderness over the left lumbar paraspinal region, no tenderness to light touch, no vesicles noted  Lymphadenopathy:     Cervical: No cervical adenopathy.  Skin:    General: Skin is warm and dry.     Coloration: Skin is not pale.     Findings: No rash.  Neurological:     Mental Status: He is alert.     Coordination: Coordination normal.      ED Treatments / Results  Labs (all labs ordered are listed, but only abnormal results are displayed) Labs Reviewed  URINALYSIS, ROUTINE W REFLEX MICROSCOPIC - Abnormal; Notable for the following components:      Result Value   Glucose, UA >=500 (*)    Ketones, ur 15 (*)    All other components within normal limits  BASIC METABOLIC PANEL - Abnormal; Notable for the following components:   Glucose, Bld 253 (*)    All other components within normal limits  URINALYSIS, MICROSCOPIC (REFLEX) - Abnormal; Notable for the following components:   Bacteria, UA RARE (*)    All other components within normal limits  CBG MONITORING, ED - Abnormal; Notable for the following components:   Glucose-Capillary 267 (*)    All other components within normal limits  POCT I-STAT EG7 - Abnormal; Notable for  the following components:   pO2, Ven 20.0 (*)    Bicarbonate 28.6 (*)    Sodium 134 (*)    All other components within normal limits  CBG MONITORING, ED - Abnormal; Notable for the following components:   Glucose-Capillary 197 (*)    All other components within normal limits  CBC WITH DIFFERENTIAL/PLATELET  I-STAT VENOUS BLOOD GAS, ED    EKG None  Radiology No results found.  Procedures Procedures (including critical care time)  Medications Ordered in ED Medications  sodium chloride 0.9 % bolus 1,000 mL (0 mLs Intravenous Stopped 07/31/19 1658)     Initial Impression / Assessment and Plan / ED Course  I have reviewed the triage vital signs and the nursing notes.  Pertinent labs & imaging results that were available during my care of the patient were reviewed by me and considered in my medical decision making (see chart for details).        Patient with hyperglycemia..  Patient had previously diet controlled diabetes.  No signs of DKA or HHS at this time.  Patient CBG improved to 197 after 1 liter of fluids.  Patient is otherwise feeling well.  Suspect musculoskeletal back pain.  No CVA tenderness.  UA is negative for infection.  No hematuria, low suspicion of kidney stone.  Recommend ice, heat, NSAID, Robaxin, stretching.  Will start Metformin 500 mg twice daily.  Follow-up with PCP for further management and to get probably an further monitoring of diabetes and increasing Metformin dose.  Return precautions discussed.  Patient understands and agrees with plan.  Patient vitals stable throughout ED course and discharged in satisfactory condition.  Final Clinical Impressions(s) / ED Diagnoses   Final diagnoses:  Hyperglycemia  Acute left-sided low back pain without sciatica    ED Discharge Orders         Ordered    metFORMIN (GLUCOPHAGE) 500 MG tablet  2 times daily     07/31/19 1637    methocarbamol (ROBAXIN) 500 MG tablet  2 times daily     07/31/19 [address] [place], PA-C 08/01/19 0041    Arby Barrette, MD 08/15/19 [phone]

## 2019-07-31 NOTE — ED Notes (Signed)
Pt verbalized understanding to pick up Rx at pharmacy listed on paperwork. Also to obtain a CBG machine and monitor his blood sugar. S/S of hypoglycemia discussed. D/c home with family

## 2019-07-31 NOTE — Discharge Instructions (Addendum)
Begin taking Metformin as prescribed twice daily.  Please see or talk to your doctor within 1 week, as this dose may need to be increased weekly.  For severe back pain, take Robaxin twice daily as needed for muscle pain or spasms.  Do not drive or operate machinery while taking this medication.  I also recommend taking ibuprofen 600 mg every 6 hours.  Use ice and heat alternating 20 minutes on, 20 minutes off.  Attempt the back exercises as tolerated daily.  It is best to try these after heating and ice afterwards.  Please return to the emergency department if you develop any increasing pain, complete numbness of your groin or leg, or any other concerning symptoms.

## 2019-08-03 ENCOUNTER — Encounter: Payer: Self-pay | Admitting: Family Medicine

## 2019-08-03 ENCOUNTER — Telehealth (INDEPENDENT_AMBULATORY_CARE_PROVIDER_SITE_OTHER): Payer: PRIVATE HEALTH INSURANCE | Admitting: Family Medicine

## 2019-08-03 DIAGNOSIS — E119 Type 2 diabetes mellitus without complications: Secondary | ICD-10-CM | POA: Diagnosis not present

## 2019-08-03 MED ORDER — METFORMIN HCL 500 MG PO TABS: 500.0000 mg | ORAL_TABLET | Freq: Two times a day (BID) | ORAL | 2 refills | Status: DC

## 2019-08-03 NOTE — Progress Notes (Signed)
Frank Roy - 58 y.o. male MRN [id number]  Date of birth: [birth year]   This visit type was conducted due to national recommendations for restrictions regarding the COVID-19 Pandemic (e.g. social distancing).  This format is felt to be most appropriate for this patient at this time.  All issues noted in this document were discussed and addressed.  No physical exam was performed (except for noted visual exam findings with Video Visits).  I discussed the limitations of evaluation and management by telemedicine and the availability of in person appointments. The patient expressed understanding and agreed to proceed.  I connected with@ on 08/03/19 at 11:00 AM EST by a video enabled telemedicine application and verified that I am speaking with the correct person using two identifiers.  Present at visit: Frank Coombe, DO [name]   Patient Location: Home [address] [place] [address]   Provider location:   [facility] [place]  Chief Complaint  Patient presents with  . Follow-up    follow up from ED hyperglycemia.Marland Kitchen pt would like to discuss medications.    HPI  Frank Roy is a 58 y.o. male who presents via audio/video conferencing for a telehealth visit today.  Patient went to ED over the weekend due to increased polyuria and polydipsia.  Diagnosed with T2DM in 06/2019.  Preferred lifestyle change over medication at that time however has noted increased symptoms over the past few weeks.  Blood sugar in ED around 250.  He was started on metformin.  He is taking this and tolerating well.  He is starting to focus more on lifestyle change.  He has been drinking a mix called "liquid IV" since this summer, each pack containing 12g of carbohydrates he would typically take 2 packs at a time and drink several bottles throughout the day.   He reports that he is feeling better since stopping this and starting metformin.     ROS:  A comprehensive ROS was completed and negative  except as noted per HPI  Past Medical History:  Diagnosis Date  . Asthma   . GERD (gastroesophageal reflux disease)     History reviewed. No pertinent surgical history.  Family History  Problem Relation Age of Onset  . Hypertension Father   . Stroke Neg Hx   . Alcohol abuse Neg Hx   . Cancer Neg Hx   . COPD Neg Hx   . Depression Neg Hx   . Diabetes Neg Hx   . Drug abuse Neg Hx   . Early death Neg Hx   . Hearing loss Neg Hx   . Heart disease Neg Hx   . Hyperlipidemia Neg Hx   . Kidney disease Neg Hx     Social History   Socioeconomic History  . Marital status: Married    Spouse name: Not on file  . Number of children: Not on file  . Years of education: Not on file  . Highest education level: Not on file  Occupational History  . Not on file  Social Needs  . Financial resource strain: Not on file  . Food insecurity    Worry: Not on file    Inability: Not on file  . Transportation needs    Medical: Not on file    Non-medical: Not on file  Tobacco Use  . Smoking status: Never Smoker  . Smokeless tobacco: Never Used  . Tobacco comment: Regular Exercise-Yes  Substance and Sexual Activity  . Alcohol use: No  . Drug use: No  .  Sexual activity: Yes  Lifestyle  . Physical activity    Days per week: Not on file    Minutes per session: Not on file  . Stress: Not on file  Relationships  . Social Herbalist on phone: Not on file    Gets together: Not on file    Attends religious service: Not on file    Active member of club or organization: Not on file    Attends meetings of clubs or organizations: Not on file    Relationship status: Not on file  . Intimate partner violence    Fear of current or ex partner: Not on file    Emotionally abused: Not on file    Physically abused: Not on file    Forced sexual activity: Not on file  Other Topics Concern  . Not on file  Social History Narrative  . Not on file     Current Outpatient Medications:  .   metFORMIN (GLUCOPHAGE) 500 MG tablet, Take 1 tablet (500 mg total) by mouth 2 (two) times daily., Disp: 60 tablet, Rfl: 2 .  atorvastatin (LIPITOR) 20 MG tablet, Take 1 tablet (20 mg total) by mouth daily. (Patient not taking: Reported on 08/03/2019), Disp: 90 tablet, Rfl: 3 .  methocarbamol (ROBAXIN) 500 MG tablet, Take 1 tablet (500 mg total) by mouth 2 (two) times daily. (Patient not taking: Reported on 08/03/2019), Disp: 20 tablet, Rfl: 0  EXAM:  VITALS per patient if applicable: Temp (!) 22.6 F (35.5 C) (Temporal) Comment: per pt Comment (Src): per pt  Ht 5\' 11"  (1.803 m)   BMI 28.03 kg/m   GENERAL: alert, oriented, appears well and in no acute distress  HEENT: atraumatic, conjunttiva clear, no obvious abnormalities on inspection of external nose and ears  NECK: normal movements of the head and neck  LUNGS: on inspection no signs of respiratory distress, breathing rate appears normal, no obvious gross SOB, gasping or wheezing  CV: no obvious cyanosis  MS: moves all visible extremities without noticeable abnormality  PSYCH/NEURO: pleasant and cooperative, no obvious depression or anxiety, speech and thought processing grossly intact  ASSESSMENT AND PLAN:  Discussed the following assessment and plan:  Type 2 diabetes mellitus without complications (HCC) Blood glucose increased over the past several weeks.  Recommend continued avoidance of liquid IV supplement.  Recommend low carb diet, he is planning on following a modified keto diet.  He is tolerating metformin well, continue for now.  Continue home cbg checks.  F/u in 1 month to update a1c.    >25 minutes spent with patient with 50% of time spent providing counseling and/or coordination of care.    I discussed the assessment and treatment plan with the patient. The patient was provided an opportunity to ask questions and all were answered. The patient agreed with the plan and demonstrated an understanding of the  instructions.   The patient was advised to call back or seek an in-person evaluation if the symptoms worsen or if the condition fails to improve as anticipated.    Luetta Nutting, DO

## 2019-08-03 NOTE — Assessment & Plan Note (Signed)
Blood glucose increased over the past several weeks.  Recommend continued avoidance of liquid IV supplement.  Recommend low carb diet, he is planning on following a modified keto diet.  He is tolerating metformin well, continue for now.  Continue home cbg checks.  F/u in 1 month to update a1c.

## 2019-08-30 ENCOUNTER — Ambulatory Visit: Payer: Self-pay | Admitting: *Deleted

## 2019-08-30 NOTE — Telephone Encounter (Signed)
Patient is calling to report he is having chronic headache for almost 2 weeks now. Comes and goes and differs in intensity from severe to mild. Patient did start metformin recently- but not a common SE.  Call to office for appointment  Reason for Disposition . [1] New headache AND [2] age > 27  Answer Assessment - Initial Assessment Questions 1. LOCATION: "Where does it hurt?"      Left temple 2. ONSET: "When did the headache start?" (Minutes, hours or days)      2 weeks 3. PATTERN: "Does the pain come and go, or has it been constant since it started?"     Comes and goes 4. SEVERITY: "How bad is the pain?" and "What does it keep you from doing?"  (e.g., Scale 1-10; mild, moderate, or severe)   - MILD (1-3): doesn't interfere with normal activities    - MODERATE (4-7): interferes with normal activities or awakens from sleep    - SEVERE (8-10): excruciating pain, unable to do any normal activities        Severe to mild 5. RECURRENT SYMPTOM: "Have you ever had headaches before?" If so, ask: "When was the last time?" and "What happened that time?"      Never this bad 6. CAUSE: "What do you think is causing the headache?"     Not sure- new medication 7. MIGRAINE: "Have you been diagnosed with migraine headaches?" If so, ask: "Is this headache similar?"      no 8. HEAD INJURY: "Has there been any recent injury to the head?"      no 9. OTHER SYMPTOMS: "Do you have any other symptoms?" (fever, stiff neck, eye pain, sore throat, cold symptoms)     Nausea 10. PREGNANCY: "Is there any chance you are pregnant?" "When was your last menstrual period?"       n/a  Protocols used: HEADACHE-A-AH

## 2019-08-31 ENCOUNTER — Other Ambulatory Visit: Payer: Self-pay

## 2019-08-31 ENCOUNTER — Other Ambulatory Visit: Payer: PRIVATE HEALTH INSURANCE

## 2019-08-31 ENCOUNTER — Encounter: Payer: Self-pay | Admitting: Family Medicine

## 2019-08-31 ENCOUNTER — Telehealth (INDEPENDENT_AMBULATORY_CARE_PROVIDER_SITE_OTHER): Payer: PRIVATE HEALTH INSURANCE | Admitting: Family Medicine

## 2019-08-31 VITALS — BP 126/76 | HR 88 | Ht 71.0 in | Wt 202.0 lb

## 2019-08-31 DIAGNOSIS — R519 Headache, unspecified: Secondary | ICD-10-CM

## 2019-08-31 LAB — SEDIMENTATION RATE: Sed Rate: 24 mm/hr — ABNORMAL HIGH (ref 0–20)

## 2019-08-31 LAB — C-REACTIVE PROTEIN: CRP: <1 mg/dL (ref 0.5–20.0)

## 2019-08-31 MED ORDER — IBUPROFEN 800 MG PO TABS: 800.0000 mg | ORAL_TABLET | Freq: Three times a day (TID) | ORAL | 0 refills | Status: DC | PRN

## 2019-08-31 NOTE — Progress Notes (Signed)
Virtual Visit via Video Note  I connected with Frank Roy on 08/31/19 at  8:00 AM EST by a video enabled telemedicine application and verified that I am speaking with the correct person using two identifiers. Location patient: home Location provider:  home office Persons participating in the virtual visit: patient, provider, patient's wife  I discussed the limitations of evaluation and management by telemedicine and the availability of in person appointments. The patient expressed understanding and agreed to proceed.  Chief Complaint  Patient presents with  . Headache    x 2 weeks,comes & goes, has not tried any otc medications   . Nausea    last 48 hours  . Photophobia    denies     HPI: Frank Roy is a 58 y.o. male who complains of a headache on/off x 2 weeks. + nausea x 2 days, no vomiting. Pain located mainly over Lt temple with occasional radiation of pain across his forehead to Rt side. Headache has become more persistent in the past 4 days. Pt denies photophobia, visual disturbances, dizziness. Pt denies nasal congestion, sinus pressure. Pt denies any falls, LOC, balance issues. He took 800mg  ibuprofen a few days ago which helped and a 500mg  tylenol last night which also helped.   Pt does not have a h/o chronic headaches. He was started on metformin about 1 mo ago.   Pt does not have a h/o HTN.  Pt wonders about brain aneurysm and also read online about temporal arteritis and other causes of headache.   Past Medical History:  Diagnosis Date  . Asthma   . GERD (gastroesophageal reflux disease)     No past surgical history on file.  Family History  Problem Relation Age of Onset  . Hypertension Father   . Stroke Neg Hx   . Alcohol abuse Neg Hx   . Cancer Neg Hx   . COPD Neg Hx   . Depression Neg Hx   . Diabetes Neg Hx   . Drug abuse Neg Hx   . Early death Neg Hx   . Hearing loss Neg Hx   . Heart disease Neg Hx   . Hyperlipidemia Neg Hx   . Kidney disease Neg  Hx     Social History   Tobacco Use  . Smoking status: Never Smoker  . Smokeless tobacco: Never Used  . Tobacco comment: Regular Exercise-Yes  Substance Use Topics  . Alcohol use: No  . Drug use: No     Current Outpatient Medications:  .  metFORMIN (GLUCOPHAGE) 500 MG tablet, Take 1 tablet (500 mg total) by mouth 2 (two) times daily., Disp: 60 tablet, Rfl: 2  No Known Allergies    ROS: See pertinent positives and negatives per HPI.   EXAM:  VITALS per patient if applicable: Ht 5\' 11"  (1.803 m)   Wt 202 lb (91.6 kg)   BMI 28.17 kg/m   BP Readings from Last 3 Encounters:  07/31/19 128/82  06/15/18 110/84  06/04/18 122/80     GENERAL: alert, oriented, appears well and in no acute distress  HEENT: atraumatic, conjunctiva clear no obvious abnormalities   NECK: normal movements of the head and neck  LUNGS: on inspection no signs of respiratory distress, breathing rate appears normal, no obvious gross SOB, gasping or wheezing, no conversational dyspnea  CV: no obvious cyanosis  MS: moves all visible extremities without noticeable abnormality  PSYCH/NEURO: pleasant and cooperative, speech and thought processing grossly intact   ASSESSMENT AND PLAN: 1. Left  temporal headache - symptoms x 2 wks, more persistent in past 4 days; no vision changes or visual disturbances - will check labs to r/o temporal arteritis based on location of pts pain - C-reactive protein; Future - Sedimentation rate; Future - BP check when pt comes to office this afternoon for lab appt - no h/o HTN - increase water intake - could be side effect of metformin but will continue this med for now and see how pt does with increased hydration, ibuprofen Rx: - ibuprofen (ADVIL) 800 MG tablet; Take 1 tablet (800 mg total) by mouth every 8 (eight) hours as needed.  Dispense: 90 tablet; Refill: 0 - pt to take TID w/ food x 2 days then PRN - will contact pt once lab results available  I discussed  the assessment and treatment plan with the patient. The patient was provided an opportunity to ask questions and all were answered. The patient agreed with the plan and demonstrated an understanding of the instructions.   The patient was advised to call back or seek an in-person evaluation if the symptoms worsen or if the condition fails to improve as anticipated.   Letta Median, DO

## 2019-08-31 NOTE — Addendum Note (Signed)
Addended by: Lynnea Ferrier on: 08/31/2019 02:02 PM   Modules accepted: Orders

## 2019-09-01 ENCOUNTER — Other Ambulatory Visit: Payer: Self-pay | Admitting: Family Medicine

## 2019-09-01 ENCOUNTER — Encounter: Payer: Self-pay | Admitting: Family Medicine

## 2019-09-01 DIAGNOSIS — R519 Headache, unspecified: Secondary | ICD-10-CM

## 2019-09-01 MED ORDER — PREDNISONE 20 MG PO TABS: 60.0000 mg | ORAL_TABLET | Freq: Every day | ORAL | 0 refills | Status: AC

## 2019-09-06 ENCOUNTER — Other Ambulatory Visit: Payer: Self-pay | Admitting: Family Medicine

## 2019-11-24 ENCOUNTER — Ambulatory Visit: Payer: PRIVATE HEALTH INSURANCE | Attending: Internal Medicine

## 2019-11-24 DIAGNOSIS — Z23 Encounter for immunization: Secondary | ICD-10-CM

## 2019-11-24 NOTE — Progress Notes (Signed)
   Covid-19 Vaccination Clinic  Name:  Frank Roy    MRN: [id number] DOB: 05-Oct-1960  11/24/2019  [name] was observed post Covid-19 immunization for 15 minutes without incident. He was provided with Vaccine Information Sheet and instruction to access the V-Safe system.   [name] was instructed to call 911 with any severe reactions post vaccine: Marland Kitchen Difficulty breathing  . Swelling of face and throat  . A fast heartbeat  . A bad rash all over body  . Dizziness and weakness   Immunizations Administered    Name Date Dose VIS Date Route   Pfizer COVID-19 Vaccine 11/24/2019 12:53 PM 0.3 mL 08/12/2019 Intramuscular   Manufacturer: [employer], [employer]   Lot: [id number]   NDC: [address]-2

## 2019-12-19 ENCOUNTER — Ambulatory Visit: Payer: PRIVATE HEALTH INSURANCE | Attending: Internal Medicine

## 2019-12-19 DIAGNOSIS — Z23 Encounter for immunization: Secondary | ICD-10-CM

## 2019-12-19 NOTE — Progress Notes (Signed)
   Covid-19 Vaccination Clinic  Name:  Frank Roy    MRN: [id number] DOB: [birth year]  12/19/2019  Frank Roy was observed post Covid-19 immunization for 15 minutes without incident. He was provided with Vaccine Information Sheet and instruction to access the V-Safe system.   Frank Roy was instructed to call 911 with any severe reactions post vaccine: Marland Kitchen Difficulty breathing  . Swelling of face and throat  . A fast heartbeat  . A bad rash all over body  . Dizziness and weakness   Immunizations Administered    Name Date Dose VIS Date Route   Pfizer COVID-19 Vaccine 12/19/2019 11:34 AM 0.3 mL 10/26/2018 Intramuscular   Manufacturer: [employer], [employer]   Lot: [id number]   NDC: [address]-0

## 2019-12-29 ENCOUNTER — Other Ambulatory Visit: Payer: Self-pay | Admitting: Family Medicine

## 2020-01-11 ENCOUNTER — Other Ambulatory Visit: Payer: Self-pay | Admitting: Family Medicine

## 2020-01-11 DIAGNOSIS — R519 Headache, unspecified: Secondary | ICD-10-CM

## 2020-01-11 NOTE — Telephone Encounter (Signed)
Last OV 08/31/19   Last fill 08/31/19  #90/0

## 2020-02-03 ENCOUNTER — Other Ambulatory Visit: Payer: Self-pay | Admitting: Family Medicine

## 2020-02-03 DIAGNOSIS — R519 Headache, unspecified: Secondary | ICD-10-CM

## 2020-02-06 NOTE — Telephone Encounter (Signed)
Last OV 08/31/19 Last fill 01/11/20  #90/0

## 2020-04-04 ENCOUNTER — Other Ambulatory Visit: Payer: Self-pay | Admitting: Family Medicine

## 2020-09-18 ENCOUNTER — Other Ambulatory Visit: Payer: Self-pay

## 2020-09-19 ENCOUNTER — Ambulatory Visit: Payer: PRIVATE HEALTH INSURANCE | Admitting: Family Medicine

## 2020-09-19 ENCOUNTER — Ambulatory Visit (INDEPENDENT_AMBULATORY_CARE_PROVIDER_SITE_OTHER): Payer: PRIVATE HEALTH INSURANCE | Admitting: Family Medicine

## 2020-09-19 ENCOUNTER — Encounter: Payer: Self-pay | Admitting: Family Medicine

## 2020-09-19 VITALS — BP 132/78 | HR 92 | Temp 98.3°F | Ht 71.0 in | Wt 193.4 lb

## 2020-09-19 DIAGNOSIS — E1169 Type 2 diabetes mellitus with other specified complication: Secondary | ICD-10-CM

## 2020-09-19 DIAGNOSIS — M545 Low back pain, unspecified: Secondary | ICD-10-CM

## 2020-09-19 DIAGNOSIS — R109 Unspecified abdominal pain: Secondary | ICD-10-CM

## 2020-09-19 DIAGNOSIS — E785 Hyperlipidemia, unspecified: Secondary | ICD-10-CM

## 2020-09-19 DIAGNOSIS — E119 Type 2 diabetes mellitus without complications: Secondary | ICD-10-CM | POA: Diagnosis not present

## 2020-09-19 NOTE — Progress Notes (Signed)
Frank Roy is a 60 y.o. male  Chief Complaint  Patient presents with  . Acute Visit    C/o having RT side flank pain since May off/on. No other symptoms.   Declines flu shot today due to just got the covid vaccine.     HPI: Frank Roy is a 60 y.o. male who complains of Rt flank pain on/off since 12/2019 (8 months). It is intermittent, occurs every other day for a few days then may go away for a week or two. Describes as a "cramp" or "ache".  He states this pain can occur on Rt or Lt side and in his back or abdomen.  Dysuria, urgency, frequency - no Gross hematuria - no Fever, chills - no Nausea or vomiting - no Diarrhea or constipation - no Weight loss - intentional, drinking more water appetite - good He has worked to improve his posture and feels that has helped.  He does not take tylenol or ibuprofen.  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   He declines flu vaccine today, recently have covid vaccine and wants to wait.   Past Medical History:  Diagnosis Date  . Asthma   . GERD (gastroesophageal reflux disease)     No past surgical history on file.  Social History   Socioeconomic History  . Marital status: Married    Spouse name: Not on file  . Number of children: Not on file  . Years of education: Not on file  . Highest education level: Not on file  Occupational History  . Not on file  Tobacco Use  . Smoking status: Never Smoker  . Smokeless tobacco: Never Used  . Tobacco comment: Regular Exercise-Yes  Vaping Use  . Vaping Use: Never used  Substance and Sexual Activity  . Alcohol use: No  . Drug use: No  . Sexual activity: Yes  Other Topics Concern  . Not on file  Social History Narrative  . Not on file   Social Determinants of Health   Financial Resource Strain: Not on file  Food Insecurity: Not on file  Transportation Needs: Not on file  Physical Activity: Not on file  Stress: Not on file  Social Connections: Not on  file  Intimate Partner Violence: Not on file    Family History  Problem Relation Age of Onset  . Hypertension Father   . Stroke Neg Hx   . Alcohol abuse Neg Hx   . Cancer Neg Hx   . COPD Neg Hx   . Depression Neg Hx   . Diabetes Neg Hx   . Drug abuse Neg Hx   . Early death Neg Hx   . Hearing loss Neg Hx   . Heart disease Neg Hx   . Hyperlipidemia Neg Hx   . Kidney disease Neg Hx      Immunization History  Administered Date(s) Administered  . PFIZER(Purple Top)SARS-COV-2 Vaccination 11/24/2019, 12/19/2019, 09/14/2020  . Pneumococcal Polysaccharide-23 03/23/2009  . Tdap 11/16/2013, 04/23/2015    Outpatient Encounter Medications as of 09/19/2020  Medication Sig  . metFORMIN (GLUCOPHAGE) 500 MG tablet TAKE 1 TABLET BY MOUTH TWICE A DAY  . ibuprofen (ADVIL) 800 MG tablet TAKE 1 TABLET BY MOUTH EVERY 8 HOURS AS NEEDED   No facility-administered encounter medications on file as of 09/19/2020.     ROS: Pertinent positives and negatives noted in HPI. Remainder of ROS non-contributory    No Known Allergies  BP 132/78   Pulse  92   Temp 98.3 F (36.8 C) (Temporal)   Ht 5\' 11"  (1.803 m)   Wt 193 lb 6.4 oz (87.7 kg)   BMI 26.97 kg/m   Physical Exam Constitutional:      General: He is not in acute distress.    Appearance: Normal appearance. He is not ill-appearing.  Abdominal:     General: Abdomen is flat. Bowel sounds are normal. There is no distension.     Palpations: Abdomen is soft. There is no mass.     Tenderness: There is no abdominal tenderness. There is no right CVA tenderness or left CVA tenderness.  Musculoskeletal:     Lumbar back: Normal. No spasms or tenderness. Normal range of motion.  Neurological:     Mental Status: He is alert.      A/P:  1. Type 2 diabetes mellitus without complication, without long-term current use of insulin (HCC) - was off metformin for months, restarted in 08/2020 - CBC; Future - Comprehensive metabolic panel; Future -  Hemoglobin A1c; Future - Lipid panel; Future - Microalbumin / creatinine urine ratio; Future  2. Hyperlipidemia associated with type 2 diabetes mellitus (HCC) - Comprehensive metabolic panel; Future - Lipase; Future - Lipid panel; Future  3. Intermittent low back pain 4. Intermittent abdominal pain - on/off x 8 mo, very sporadic, no associated symptoms - likely MSK in nature, will check labs (see above)  Pt is overdue for DM f/u and will schedule appt for this.  This visit occurred during the SARS-CoV-2 public health emergency.  Safety protocols were in place, including screening questions prior to the visit, additional usage of staff PPE, and extensive cleaning of exam room while observing appropriate contact time as indicated for disinfecting solutions.

## 2020-09-20 ENCOUNTER — Other Ambulatory Visit: Payer: Self-pay

## 2020-09-20 ENCOUNTER — Other Ambulatory Visit: Payer: PRIVATE HEALTH INSURANCE

## 2020-09-20 ENCOUNTER — Other Ambulatory Visit (INDEPENDENT_AMBULATORY_CARE_PROVIDER_SITE_OTHER): Payer: PRIVATE HEALTH INSURANCE

## 2020-09-20 DIAGNOSIS — E1169 Type 2 diabetes mellitus with other specified complication: Secondary | ICD-10-CM

## 2020-09-20 DIAGNOSIS — E119 Type 2 diabetes mellitus without complications: Secondary | ICD-10-CM | POA: Diagnosis not present

## 2020-09-20 DIAGNOSIS — E785 Hyperlipidemia, unspecified: Secondary | ICD-10-CM

## 2020-09-20 LAB — CBC
HCT: 43.7 % (ref 39.0–52.0)
Hemoglobin: 14.7 g/dL (ref 13.0–17.0)
MCHC: 33.6 g/dL (ref 30.0–36.0)
MCV: 86.8 fl (ref 78.0–100.0)
Platelets: 212 10*3/uL (ref 150.0–400.0)
RBC: 5.03 Mil/uL (ref 4.22–5.81)
RDW: 13.1 % (ref 11.5–15.5)
WBC: 3.8 10*3/uL — ABNORMAL LOW (ref 4.0–10.5)

## 2020-09-20 LAB — COMPREHENSIVE METABOLIC PANEL
ALT: 16 U/L (ref 0–53)
AST: 14 U/L (ref 0–37)
Albumin: 4.4 g/dL (ref 3.5–5.2)
Alkaline Phosphatase: 131 U/L — ABNORMAL HIGH (ref 39–117)
BUN: 11 mg/dL (ref 6–23)
CO2: 33 mEq/L — ABNORMAL HIGH (ref 19–32)
Calcium: 9.6 mg/dL (ref 8.4–10.5)
Chloride: 97 mEq/L (ref 96–112)
Creatinine, Ser: 1.18 mg/dL (ref 0.40–1.50)
GFR: 66.9 mL/min (ref >60.00)
Glucose, Bld: 363 mg/dL — ABNORMAL HIGH (ref 70–99)
Potassium: 3.9 mEq/L (ref 3.5–5.1)
Sodium: 134 mEq/L — ABNORMAL LOW (ref 135–145)
Total Bilirubin: 0.5 mg/dL (ref 0.2–1.2)
Total Protein: 7.8 g/dL (ref 6.0–8.3)

## 2020-09-20 LAB — MICROALBUMIN / CREATININE URINE RATIO
Creatinine,U: 63.1 mg/dL
Microalb Creat Ratio: 4.8 mg/g (ref 0.0–30.0)
Microalb, Ur: 3 mg/dL — ABNORMAL HIGH (ref 0.0–1.9)

## 2020-09-20 LAB — LIPID PANEL
Cholesterol: 190 mg/dL (ref 0–200)
HDL: 44.3 mg/dL (ref >39.00)
LDL Cholesterol: 114 mg/dL — ABNORMAL HIGH (ref 0–99)
NonHDL: 145.7
Total CHOL/HDL Ratio: 4
Triglycerides: 160 mg/dL — ABNORMAL HIGH (ref 0.0–149.0)
VLDL: 32 mg/dL (ref 0.0–40.0)

## 2020-09-20 LAB — LIPASE: Lipase: 75 U/L — ABNORMAL HIGH (ref 11.0–59.0)

## 2020-09-21 ENCOUNTER — Telehealth: Payer: Self-pay | Admitting: Family Medicine

## 2020-09-21 ENCOUNTER — Encounter: Payer: Self-pay | Admitting: Family Medicine

## 2020-09-21 LAB — HEMOGLOBIN A1C
Hgb A1c MFr Bld: 11.6 % of total Hgb — ABNORMAL HIGH (ref <5.7)
Mean Plasma Glucose: 286 mg/dL
eAG (mmol/L): 15.9 mmol/L

## 2020-09-21 NOTE — Telephone Encounter (Signed)
Patient is calling regarding his lab results. He states that he reviewed them and would like to start any medications she wants to prescribe. I advised patient that Dr. Salena Saner was not in the office and had not reviewed the labs yet. He understood and asked that I still send the message. Please call him at [phone] if you have any questions.

## 2020-09-24 ENCOUNTER — Encounter: Payer: Self-pay | Admitting: Family Medicine

## 2020-09-24 DIAGNOSIS — E119 Type 2 diabetes mellitus without complications: Secondary | ICD-10-CM

## 2020-09-24 DIAGNOSIS — E1169 Type 2 diabetes mellitus with other specified complication: Secondary | ICD-10-CM

## 2020-09-25 ENCOUNTER — Telehealth: Payer: Self-pay | Admitting: Nurse Practitioner

## 2020-09-25 DIAGNOSIS — E1165 Type 2 diabetes mellitus with hyperglycemia: Secondary | ICD-10-CM

## 2020-09-25 DIAGNOSIS — Z794 Long term (current) use of insulin: Secondary | ICD-10-CM

## 2020-09-25 MED ORDER — PEN NEEDLES 31G X 6 MM MISC: 1.0000 "application " | Freq: Two times a day (BID) | 1 refills | Status: DC

## 2020-09-25 MED ORDER — NOVOLIN 70/30 FLEXPEN (70-30) 100 UNIT/ML ~~LOC~~ SUPN: 10.0000 [IU] | PEN_INJECTOR | Freq: Two times a day (BID) | SUBCUTANEOUS | 1 refills | Status: DC

## 2020-09-25 NOTE — Telephone Encounter (Signed)
Patient called back to let his provider know that the name of his glucometer is True Metrix.

## 2020-09-25 NOTE — Telephone Encounter (Signed)
Mr. Frank Roy was informed about lab results. He was advised to hold metformin due to elevated lipase. Start novolin injections BID with meals and glucose check BID before meals. Hold novolin if Glucose <120. F/up with pcp in 2week. He is to bring glucose readings.

## 2020-09-25 NOTE — Telephone Encounter (Signed)
I responded to his mychart message about this same issue

## 2020-09-26 ENCOUNTER — Encounter: Payer: Self-pay | Admitting: Family Medicine

## 2020-09-26 ENCOUNTER — Telehealth (INDEPENDENT_AMBULATORY_CARE_PROVIDER_SITE_OTHER): Payer: PRIVATE HEALTH INSURANCE | Admitting: Family Medicine

## 2020-09-26 VITALS — Ht 71.0 in | Wt 193.0 lb

## 2020-09-26 DIAGNOSIS — E785 Hyperlipidemia, unspecified: Secondary | ICD-10-CM

## 2020-09-26 DIAGNOSIS — E119 Type 2 diabetes mellitus without complications: Secondary | ICD-10-CM

## 2020-09-26 DIAGNOSIS — E1169 Type 2 diabetes mellitus with other specified complication: Secondary | ICD-10-CM | POA: Diagnosis not present

## 2020-09-26 MED ORDER — LISINOPRIL 2.5 MG PO TABS: 2.5000 mg | ORAL_TABLET | Freq: Every day | ORAL | 3 refills | Status: DC

## 2020-09-26 MED ORDER — ATORVASTATIN CALCIUM 40 MG PO TABS
40.0000 mg | ORAL_TABLET | Freq: Every day | ORAL | 3 refills | Status: DC
Start: 2020-09-26 — End: 2021-11-15

## 2020-09-26 MED ORDER — TRUE METRIX BLOOD GLUCOSE TEST VI STRP: ORAL_STRIP | 12 refills | Status: DC

## 2020-09-26 NOTE — Telephone Encounter (Signed)
I sent Rx for lisinopril 2.5mg  daily and lipitor 40mg  daily to his pharmacy. I would like to discuss the insulin at our appt so we decide together at that time

## 2020-09-26 NOTE — Telephone Encounter (Signed)
Pt called and I scheduled him a televisit for 3pm, Pt said he would still like a response back from you beforehand about if you want him to take the insulin, and if you are going to send in lisinopril and a medication for his LDL. Please advise

## 2020-09-26 NOTE — Progress Notes (Signed)
Virtual Visit via Telephone Note  I connected with Frank Roy on 09/26/20 at  3:00 PM EST by telephone and verified that I am speaking with the correct person using two identifiers.   I discussed the limitations, risks, security and privacy concerns of performing an evaluation and management service by telephone and the availability of in person appointments. I also discussed with the patient that there may be a patient responsible charge related to this service. The patient expressed understanding and agreed to proceed.  Location patient: home Location provider: work  Participants present for the call: patient, provider Patient did not have a visit in the prior 7 days to address this/these issue(s).  Chief Complaint  Patient presents with  . Follow-up    F/u to discuss meds. Declines flu shot.      History of Present Illness: Frank Roy is a 60 y.o. male with uncontrolled DM, HLD. He had labs done 1 week ago. Prior to that, his last A1C was 6.7 in 06/2018.  Via [employer], pt has agreed to start lisinopril 2.5mg  daily and lipitor 40mg  daily.  My colleague had responded to pts telephone message while I was out of the office and Rx'd novolin 70/30 10units BID w/ meals and advised pt to stop his metformin.    Lab Results  Component Value Date   HGBA1C 11.6 (H) 09/20/2020   Lab Results  Component Value Date   CHOL 190 09/20/2020   HDL 44.30 09/20/2020   LDLCALC 114 (H) 09/20/2020   LDLDIRECT 158.5 04/18/2010   TRIG 160.0 (H) 09/20/2020   CHOLHDL 4 09/20/2020   Lab Results  Component Value Date   NA 134 (L) 09/20/2020   K 3.9 09/20/2020   CREATININE 1.18 09/20/2020   GFRNONAA >60 07/31/2019   GFRAA >60 07/31/2019   GLUCOSE 363 (H) 09/20/2020   Lab Results  Component Value Date   MICROALBUR 3.0 (H) 09/20/2020   Lab Results  Component Value Date   ALT 16 09/20/2020   AST 14 09/20/2020   ALKPHOS 131 (H) 09/20/2020   BILITOT 0.5 09/20/2020    Lab Results  Component Value Date   LIPASE 75.0 (H) 09/20/2020    Past Medical History:  Diagnosis Date  . Asthma   . GERD (gastroesophageal reflux disease)     No past surgical history on file.  Social History   Tobacco Use  . Smoking status: Never Smoker  . Smokeless tobacco: Never Used  . Tobacco comment: Regular Exercise-Yes  Vaping Use  . Vaping Use: Never used  Substance Use Topics  . Alcohol use: No  . Drug use: No    Family History  Problem Relation Age of Onset  . Hypertension Father   . Stroke Neg Hx   . Alcohol abuse Neg Hx   . Cancer Neg Hx   . COPD Neg Hx   . Depression Neg Hx   . Diabetes Neg Hx   . Drug abuse Neg Hx   . Early death Neg Hx   . Hearing loss Neg Hx   . Heart disease Neg Hx   . Hyperlipidemia Neg Hx   . Kidney disease Neg Hx     Outpatient Encounter Medications as of 09/26/2020  Medication Sig  . atorvastatin (LIPITOR) 40 MG tablet Take 1 tablet (40 mg total) by mouth daily.  09/28/2020 glucose blood (TRUE METRIX BLOOD GLUCOSE TEST) test strip Use as instructed  . insulin isophane & regular human (NOVOLIN 70/30 FLEXPEN) (70-30) 100  UNIT/ML KwikPen Inject 10 Units into the skin 2 (two) times daily with a meal.  . Insulin Pen Needle (PEN NEEDLES) 31G X 6 MM MISC 1 application by Does not apply route in the morning and at bedtime.  Marland Kitchen lisinopril (ZESTRIL) 2.5 MG tablet Take 1 tablet (2.5 mg total) by mouth daily.  . [DISCONTINUED] metFORMIN (GLUCOPHAGE) 500 MG tablet TAKE 1 TABLET BY MOUTH TWICE A DAY (Patient not taking: Reported on 09/26/2020)   No facility-administered encounter medications on file as of 09/26/2020.     No Known Allergies    ROS: See pertinent positives and negatives per HPI.   Observations/Objective: Patient sounds cheerful and well on the phone. I do not appreciate any SOB. Speech and thought processing are grossly intact. Patient reported vitals:   Assessment and Plan:  1. Type 2 diabetes mellitus without  complication, unspecified whether long term insulin use (HCC) - uncontrolled - pt is agreeable to insulin previously Rx'd by my colleague Alysia Penna and he plans to start tonight 10 units BID w/ meals - he has glucometer and refill of test strips sent to pharmacy today. He will check BS 2x/day - fasting and then 1-2hrs post-prandial - start lisinopril 2.5mg  daily, lipitor 40mg  daily - plan for f/u on 10/10/20 at 10am as pt discussed with 12/08/20 yesterday  2. Hyperlipidemia associated with type 2 diabetes mellitus (HCC) - start lipitor 40mg  daily - low fat diet, cont regular CV exercise - recheck FLP in 4-61mo  I did not refer this patient for an OV in the next 24 hours for this/these issue(s).  I discussed the assessment and treatment plan with the patient. The patient was provided an opportunity to ask questions and all were answered. The patient agreed with the plan and demonstrated an understanding of the instructions.   The patient was advised to call back or seek an in-person evaluation if the symptoms worsen or if the condition fails to improve as anticipated.  I provided 14 minutes of non-face-to-face time during this encounter.   , DO

## 2020-09-26 NOTE — Telephone Encounter (Signed)
Message already responded to by my chart.  Will discuss the insulin at [facility] today. Dm/cma

## 2020-09-26 NOTE — Telephone Encounter (Signed)
RX sent to the pharmacy for meter during appt today. Dm/cma

## 2020-09-27 ENCOUNTER — Encounter: Payer: Self-pay | Admitting: Family Medicine

## 2020-09-27 ENCOUNTER — Other Ambulatory Visit: Payer: Self-pay

## 2020-09-27 DIAGNOSIS — E119 Type 2 diabetes mellitus without complications: Secondary | ICD-10-CM

## 2020-09-27 MED ORDER — TRUE METRIX BLOOD GLUCOSE TEST VI STRP: ORAL_STRIP | 12 refills | Status: DC

## 2020-10-08 ENCOUNTER — Other Ambulatory Visit: Payer: Self-pay

## 2020-10-08 ENCOUNTER — Other Ambulatory Visit: Payer: PRIVATE HEALTH INSURANCE

## 2020-10-08 ENCOUNTER — Encounter: Payer: Self-pay | Admitting: Family Medicine

## 2020-10-09 ENCOUNTER — Other Ambulatory Visit: Payer: Self-pay

## 2020-10-10 ENCOUNTER — Ambulatory Visit (INDEPENDENT_AMBULATORY_CARE_PROVIDER_SITE_OTHER): Payer: PRIVATE HEALTH INSURANCE | Admitting: Family Medicine

## 2020-10-10 ENCOUNTER — Encounter: Payer: Self-pay | Admitting: Family Medicine

## 2020-10-10 VITALS — BP 126/78 | HR 68 | Temp 97.7°F | Ht 71.0 in | Wt 193.2 lb

## 2020-10-10 DIAGNOSIS — Z794 Long term (current) use of insulin: Secondary | ICD-10-CM | POA: Diagnosis not present

## 2020-10-10 DIAGNOSIS — E119 Type 2 diabetes mellitus without complications: Secondary | ICD-10-CM

## 2020-10-10 MED ORDER — PIOGLITAZONE HCL 15 MG PO TABS: 15.0000 mg | ORAL_TABLET | Freq: Every day | ORAL | 1 refills | Status: DC

## 2020-10-10 NOTE — Progress Notes (Signed)
Chief Complaint  Patient presents with  . Diabetes    F/u DM and labs.  Average BS 349 and low 126.  Declines flu shot today.      HPI: *Frank Roy is a 60 y.o. male here for DM follow-up. He was started on novolin 70/30 10 units BID w/ meals on 09/26/20.  Today he reports checking his BS at home. Lowest reading = 126. Highest = 349. Average = 240-250 Hypoglycemia/Hypergylcemic episodes: no Diet: plant based diet  Lab Results  Component Value Date   HGBA1C 11.6 (H) 09/20/2020   Lab Results  Component Value Date   MICROALBUR 3.0 (H) 09/20/2020   Lab Results  Component Value Date   CREATININE 1.18 09/20/2020   Lab Results  Component Value Date   CHOL 190 09/20/2020   HDL 44.30 09/20/2020   LDLCALC 114 (H) 09/20/2020   LDLDIRECT 158.5 04/18/2010   TRIG 160.0 (H) 09/20/2020   CHOLHDL 4 09/20/2020    The 10-year ASCVD risk score Denman George DC Jr., et al., 2013) is: 25.7%   Values used to calculate the score:     Age: 41 years     Sex: Male     Is Non-Hispanic African American: Yes     Diabetic: Yes     Tobacco smoker: No     Systolic Blood Pressure: 132 mmHg     Is BP treated: Yes     HDL Cholesterol: 44.3 mg/dL     Total Cholesterol: 190 mg/dL   Past Medical History:  Diagnosis Date  . Asthma   . GERD (gastroesophageal reflux disease)     No past surgical history on file.  Social History   Socioeconomic History  . Marital status: Married    Spouse name: Not on file  . Number of children: Not on file  . Years of education: Not on file  . Highest education level: Not on file  Occupational History  . Not on file  Tobacco Use  . Smoking status: Never Smoker  . Smokeless tobacco: Never Used  . Tobacco comment: Regular Exercise-Yes  Vaping Use  . Vaping Use: Never used  Substance and Sexual Activity  . Alcohol use: No  . Drug use: No  . Sexual activity: Yes  Other Topics Concern  . Not on file  Social History Narrative  . Not on file   Social  Determinants of Health   Financial Resource Strain: Not on file  Food Insecurity: Not on file  Transportation Needs: Not on file  Physical Activity: Not on file  Stress: Not on file  Social Connections: Not on file  Intimate Partner Violence: Not on file    Family History  Problem Relation Age of Onset  . Hypertension Father   . Stroke Neg Hx   . Alcohol abuse Neg Hx   . Cancer Neg Hx   . COPD Neg Hx   . Depression Neg Hx   . Diabetes Neg Hx   . Drug abuse Neg Hx   . Early death Neg Hx   . Hearing loss Neg Hx   . Heart disease Neg Hx   . Hyperlipidemia Neg Hx   . Kidney disease Neg Hx      Immunization History  Administered Date(s) Administered  . PFIZER(Purple Top)SARS-COV-2 Vaccination 11/24/2019, 12/19/2019, 09/14/2020  . Pneumococcal Polysaccharide-23 03/23/2009  . Tdap 11/16/2013, 04/23/2015    Outpatient Encounter Medications as of 10/10/2020  Medication Sig  . atorvastatin (LIPITOR) 40 MG tablet Take 1  tablet (40 mg total) by mouth daily.  Marland Kitchen glucose blood (TRUE METRIX BLOOD GLUCOSE TEST) test strip Twice daily  . insulin isophane & regular human (NOVOLIN 70/30 FLEXPEN) (70-30) 100 UNIT/ML KwikPen Inject 10 Units into the skin 2 (two) times daily with a meal.  . Insulin Pen Needle (PEN NEEDLES) 31G X 6 MM MISC 1 application by Does not apply route in the morning and at bedtime.  Marland Kitchen lisinopril (ZESTRIL) 2.5 MG tablet Take 1 tablet (2.5 mg total) by mouth daily.   No facility-administered encounter medications on file as of 10/10/2020.     ROS: Pertinent positives and negatives noted in HPI. Remainder of ROS non-contributory    No Known Allergies  There were no vitals taken for this visit.  Wt Readings from Last 3 Encounters:  09/26/20 193 lb (87.5 kg)  09/19/20 193 lb 6.4 oz (87.7 kg)  08/31/19 202 lb (91.6 kg)   Temp Readings from Last 3 Encounters:  09/19/20 98.3 F (36.8 C) (Temporal)  08/03/19 (!) 95.9 F (35.5 C) (Temporal)  07/31/19 98.3 F (36.8  C) (Oral)   BP Readings from Last 3 Encounters:  09/19/20 132/78  08/31/19 126/76  07/31/19 128/82   Pulse Readings from Last 3 Encounters:  09/19/20 92  08/31/19 88  07/31/19 68     Physical Exam Constitutional:      General: He is not in acute distress.    Appearance: Normal appearance. He is not ill-appearing.  Cardiovascular:     Rate and Rhythm: Normal rate and regular rhythm.     Pulses: Normal pulses.  Pulmonary:     Effort: Pulmonary effort is normal. No respiratory distress.     Breath sounds: Normal breath sounds.  Abdominal:     Tenderness: There is no right CVA tenderness or left CVA tenderness.  Neurological:     Mental Status: He is alert and oriented to person, place, and time.  Psychiatric:        Mood and Affect: Mood normal.        Behavior: Behavior normal.      A/P:  1. Type 2 diabetes mellitus without complication, with long-term current use of insulin (HCC) - cont with improved diet - increase novolin 70/30 from 10 to 12 units 2x/day w/ meals Rx: - pioglitazone (ACTOS) 15 MG tablet; Take 1 tablet (15 mg total) by mouth daily.  Dispense: 90 tablet; Refill: 1 - cont to check BS BID and keep log - f/u in 2-2.5 mo or sooner PRN   This visit occurred during the SARS-CoV-2 public health emergency.  Safety protocols were in place, including screening questions prior to the visit, additional usage of staff PPE, and extensive cleaning of exam room while observing appropriate contact time as indicated for disinfecting solutions.

## 2020-10-10 NOTE — Patient Instructions (Addendum)
Increase novolin 70/30 from 10 units to 12 units 2x/day with meals Start actos 15mg  1 tab daily Continue to check blood sugars 2x/day - fasting in AM and 2hrs after either lunch or dinner Goal fasting under 120 and goal post-prandial under 180-200 Follow-up in 2-2.5 months  Consider continuous blood glucose monitor and let me know

## 2020-10-15 ENCOUNTER — Encounter: Payer: Self-pay | Admitting: Gastroenterology

## 2020-10-25 ENCOUNTER — Ambulatory Visit: Payer: PRIVATE HEALTH INSURANCE | Admitting: Family Medicine

## 2020-11-30 ENCOUNTER — Other Ambulatory Visit: Payer: Self-pay

## 2020-11-30 ENCOUNTER — Ambulatory Visit (INDEPENDENT_AMBULATORY_CARE_PROVIDER_SITE_OTHER): Payer: PRIVATE HEALTH INSURANCE | Admitting: Family Medicine

## 2020-11-30 ENCOUNTER — Encounter: Payer: Self-pay | Admitting: Family Medicine

## 2020-11-30 VITALS — BP 124/88 | HR 76 | Temp 98.2°F | Ht 71.0 in | Wt 195.8 lb

## 2020-11-30 DIAGNOSIS — M545 Low back pain, unspecified: Secondary | ICD-10-CM

## 2020-11-30 DIAGNOSIS — M549 Dorsalgia, unspecified: Secondary | ICD-10-CM

## 2020-11-30 DIAGNOSIS — Z794 Long term (current) use of insulin: Secondary | ICD-10-CM

## 2020-11-30 DIAGNOSIS — E119 Type 2 diabetes mellitus without complications: Secondary | ICD-10-CM

## 2020-11-30 MED ORDER — IBUPROFEN 600 MG PO TABS
600.0000 mg | ORAL_TABLET | Freq: Three times a day (TID) | ORAL | 0 refills | Status: DC | PRN
Start: 2020-11-30 — End: 2021-02-08

## 2020-11-30 NOTE — Progress Notes (Signed)
Frank Roy is a 60 y.o. male  Chief Complaint  Patient presents with  . Back Pain    Pt c/o lower back pain.  Pt went UC on Wednesday for the back pain and also did some blood work.  Pt would lkike to discuss results with provider.    HPI: Frank Roy is a 60 y.o. male who complains of lower back pain x 1 mo. He also notes ongoing "stitches" in his upper abdomen on/off. These last 2hrs or so then resolve and return.  Pt was seen at UC 2 days ago for above symptoms and states he had labs and urine test done - all normal. He was Rx muscle relaxant yesterday but has not taken it. He has not taken any meds for his back.  He has not bee able to associate any food or type of food to above symptoms. Normal BM 1-2x/day.  No urinary symptoms.    Past Medical History:  Diagnosis Date  . Asthma   . GERD (gastroesophageal reflux disease)     History reviewed. No pertinent surgical history.  Social History   Socioeconomic History  . Marital status: Married    Spouse name: Not on file  . Number of children: Not on file  . Years of education: Not on file  . Highest education level: Not on file  Occupational History  . Not on file  Tobacco Use  . Smoking status: Never Smoker  . Smokeless tobacco: Never Used  . Tobacco comment: Regular Exercise-Yes  Vaping Use  . Vaping Use: Never used  Substance and Sexual Activity  . Alcohol use: No  . Drug use: No  . Sexual activity: Yes  Other Topics Concern  . Not on file  Social History Narrative  . Not on file   Social Determinants of Health   Financial Resource Strain: Not on file  Food Insecurity: Not on file  Transportation Needs: Not on file  Physical Activity: Not on file  Stress: Not on file  Social Connections: Not on file  Intimate Partner Violence: Not on file    Family History  Problem Relation Age of Onset  . Hypertension Father   . Stroke Neg Hx   . Alcohol abuse Neg Hx   . Cancer Neg Hx   . COPD Neg Hx   .  Depression Neg Hx   . Diabetes Neg Hx   . Drug abuse Neg Hx   . Early death Neg Hx   . Hearing loss Neg Hx   . Heart disease Neg Hx   . Hyperlipidemia Neg Hx   . Kidney disease Neg Hx      Immunization History  Administered Date(s) Administered  . PFIZER(Purple Top)SARS-COV-2 Vaccination 11/24/2019, 12/19/2019, 09/14/2020  . Pneumococcal Polysaccharide-23 03/23/2009  . Tdap 11/16/2013, 04/23/2015    Outpatient Encounter Medications as of 11/30/2020  Medication Sig  . glucose blood (TRUE METRIX BLOOD GLUCOSE TEST) test strip Twice daily  . ibuprofen (ADVIL) 600 MG tablet Take 1 tablet (600 mg total) by mouth every 8 (eight) hours as needed.  . insulin isophane & regular human (NOVOLIN 70/30 FLEXPEN) (70-30) 100 UNIT/ML KwikPen Inject 10 Units into the skin 2 (two) times daily with a meal.  . Insulin Pen Needle (PEN NEEDLES) 31G X 6 MM MISC 1 application by Does not apply route in the morning and at bedtime.  Marland Kitchen lisinopril (ZESTRIL) 2.5 MG tablet Take 1 tablet (2.5 mg total) by mouth daily.  . pioglitazone (ACTOS) 15  MG tablet Take 1 tablet (15 mg total) by mouth daily.  Marland Kitchen atorvastatin (LIPITOR) 40 MG tablet Take 1 tablet (40 mg total) by mouth daily. (Patient not taking: Reported on 11/30/2020)  . cyclobenzaprine (FLEXERIL) 5 MG tablet Take by mouth. (Patient not taking: Reported on 11/30/2020)   No facility-administered encounter medications on file as of 11/30/2020.     ROS: Pertinent positives and negatives noted in HPI. Remainder of ROS non-contributory   No Known Allergies  BP 124/88 (BP Location: Left Arm, Patient Position: Sitting, Cuff Size: Normal)   Pulse 76   Temp 98.2 F (36.8 C) (Temporal)   Ht 5\' 11"  (1.803 m)   Wt 195 lb 12.8 oz (88.8 kg)   SpO2 97%   BMI 27.31 kg/m   Wt Readings from Last 3 Encounters:  11/30/20 195 lb 12.8 oz (88.8 kg)  10/10/20 193 lb 3.2 oz (87.6 kg)  09/26/20 193 lb (87.5 kg)   Temp Readings from Last 3 Encounters:  11/30/20 98.2 F  (36.8 C) (Temporal)  10/10/20 97.7 F (36.5 C) (Temporal)  09/19/20 98.3 F (36.8 C) (Temporal)   BP Readings from Last 3 Encounters:  11/30/20 124/88  10/10/20 126/78  09/19/20 132/78   Pulse Readings from Last 3 Encounters:  11/30/20 76  10/10/20 68  09/19/20 92     Physical Exam Constitutional:      General: He is not in acute distress.    Appearance: Normal appearance. He is not ill-appearing.  Musculoskeletal:     Lumbar back: Tenderness (Rt lumbar paraspinal TTP) present. No swelling, deformity, spasms or bony tenderness. Normal range of motion.  Neurological:     Mental Status: He is alert and oriented to person, place, and time.  Psychiatric:        Mood and Affect: Mood normal.        Behavior: Behavior normal.      A/P:  1. Intermittent low back pain 2. Musculoskeletal back pain - improved, seen at UC 2 days ago - discussed use of heating pad, daily stretching, PRN NSAID, PRN muscle relaxant Refill: - ibuprofen (ADVIL) 600 MG tablet; Take 1 tablet (600 mg total) by mouth every 8 (eight) hours as needed.  Dispense: 60 tablet; Refill: 0 - f/u PRN   This visit occurred during the SARS-CoV-2 public health emergency.  Safety protocols were in place, including screening questions prior to the visit, additional usage of staff PPE, and extensive cleaning of exam room while observing appropriate contact time as indicated for disinfecting solutions.

## 2020-11-30 NOTE — Patient Instructions (Signed)
Health Maintenance Due  Topic Date Due  . Hepatitis C Screening  Never done  . FOOT EXAM  Never done  . OPHTHALMOLOGY EXAM  Never done  . HIV Screening  Never done  . COLONOSCOPY (Pts 45-65yrs Insurance coverage will need to be confirmed)  06/13/2020    Depression screen [facility] 2/9 09/19/2020 04/23/2015  Decreased Interest 0 0  Down, Depressed, Hopeless 0 0  PHQ - 2 Score 0 0

## 2021-01-09 ENCOUNTER — Emergency Department (HOSPITAL_COMMUNITY)
Admission: EM | Admit: 2021-01-09 | Discharge: 2021-01-09 | Disposition: A | Discharge status: Home / Self Care | Payer: Self-pay | Attending: Emergency Medicine | Admitting: Emergency Medicine

## 2021-01-09 ENCOUNTER — Encounter (HOSPITAL_COMMUNITY): Payer: Self-pay | Admitting: Emergency Medicine

## 2021-01-09 ENCOUNTER — Telehealth: Payer: Self-pay | Admitting: Family Medicine

## 2021-01-09 ENCOUNTER — Emergency Department (HOSPITAL_COMMUNITY): Payer: Self-pay

## 2021-01-09 ENCOUNTER — Encounter: Payer: Self-pay | Admitting: Family Medicine

## 2021-01-09 DIAGNOSIS — R079 Chest pain, unspecified: Secondary | ICD-10-CM

## 2021-01-09 DIAGNOSIS — R002 Palpitations: Secondary | ICD-10-CM

## 2021-01-09 DIAGNOSIS — Z7984 Long term (current) use of oral hypoglycemic drugs: Secondary | ICD-10-CM | POA: Insufficient documentation

## 2021-01-09 DIAGNOSIS — R0602 Shortness of breath: Secondary | ICD-10-CM | POA: Insufficient documentation

## 2021-01-09 DIAGNOSIS — Z79899 Other long term (current) drug therapy: Secondary | ICD-10-CM | POA: Insufficient documentation

## 2021-01-09 DIAGNOSIS — Z794 Long term (current) use of insulin: Secondary | ICD-10-CM | POA: Insufficient documentation

## 2021-01-09 DIAGNOSIS — J45909 Unspecified asthma, uncomplicated: Secondary | ICD-10-CM | POA: Insufficient documentation

## 2021-01-09 DIAGNOSIS — R059 Cough, unspecified: Secondary | ICD-10-CM | POA: Insufficient documentation

## 2021-01-09 DIAGNOSIS — E119 Type 2 diabetes mellitus without complications: Secondary | ICD-10-CM | POA: Insufficient documentation

## 2021-01-09 DIAGNOSIS — I10 Essential (primary) hypertension: Secondary | ICD-10-CM | POA: Insufficient documentation

## 2021-01-09 LAB — BASIC METABOLIC PANEL
Anion gap: 6 (ref 5–15)
BUN: 17 mg/dL (ref 8–23)
CO2: 23 mmol/L (ref 22–32)
Calcium: 9.3 mg/dL (ref 8.9–10.3)
Chloride: 110 mmol/L (ref 98–111)
Creatinine, Ser: 1.06 mg/dL (ref 0.61–1.24)
GFR, Estimated: >60 mL/min (ref >60)
Glucose, Bld: 78 mg/dL (ref 70–99)
Potassium: 3.9 mmol/L (ref 3.5–5.1)
Sodium: 139 mmol/L (ref 135–145)

## 2021-01-09 LAB — CBC
HCT: 42.1 % (ref 39.0–52.0)
Hemoglobin: 13.9 g/dL (ref 13.0–17.0)
MCH: 29 pg (ref 26.0–34.0)
MCHC: 33 g/dL (ref 30.0–36.0)
MCV: 87.9 fL (ref 80.0–100.0)
Platelets: 236 10*3/uL (ref 150–400)
RBC: 4.79 MIL/uL (ref 4.22–5.81)
RDW: 12.9 % (ref 11.5–15.5)
WBC: 7.9 10*3/uL (ref 4.0–10.5)
nRBC: 0 % (ref 0.0–0.2)

## 2021-01-09 LAB — TROPONIN I (HIGH SENSITIVITY)
Troponin I (High Sensitivity): 15 ng/L (ref <18)
Troponin I (High Sensitivity): 14 ng/L (ref <18)

## 2021-01-09 LAB — CBG MONITORING, ED: Glucose-Capillary: 95 mg/dL (ref 70–99)

## 2021-01-09 LAB — D-DIMER, QUANTITATIVE: D-Dimer, Quant: 0.38 ug/mL-FEU (ref 0.00–0.50)

## 2021-01-09 NOTE — Discharge Instructions (Signed)
Your testing has been normal We have not seen any blood work / heart attack / low potassium or any other cause of your discomfort or palpitations Please see your doctor for holter monitor testing  If they cannot do it - you can call for appointment with the cardiology clinic - I have listed the on call doctor here.

## 2021-01-09 NOTE — Telephone Encounter (Signed)
Pt walked in and said he was having a little pain under his left peck but no other symptoms, he wanted to see if a nurse could just check him real quick, I talked with our nurse supervisor and she said recommends pt to go to ER or we can call 911 for pt, he declined and was upset that we couldn't work him in but unfortunately we were completely booked. I did offer again if he believed it was an emergency that I could call 911 for him and he turned around and left.

## 2021-01-09 NOTE — ED Provider Notes (Signed)
Pt has a negative trop X 2 -  Vitals:   01/09/21 1358 01/09/21 1547 01/09/21 1745 01/09/21 1845  BP:  110/73 133/84 120/71  Pulse:  73 78 85  Resp:  18 15 14   Temp: 97.9 F (36.6 C) 97.7 F (36.5 C)    TempSrc: Oral Oral    SpO2:  99% 99% 100%    Stable for d/c Can f/u for holter testing Pt agreeable.   , MD 01/09/21 [phone]

## 2021-01-09 NOTE — ED Triage Notes (Signed)
Patient sent from Urgent Care via [facility] for persistent chest pain that started last night. Patient states chest pain went from 7/10 to 2/10 after 324mg  ASA. Patient alert, oriented, and in no apparent distress at this time.

## 2021-01-09 NOTE — ED Provider Notes (Signed)
[facility] EMERGENCY DEPARTMENT Provider Note   CSN: [id number] Arrival date & time: 01/09/21  1353     History Chief Complaint  Patient presents with  . Chest Pain    Frank Roy is a 60 y.o. male.  He also felt palpitations this morning.  The history is provided by the patient.  Chest Pain Pain location:  L chest Pain quality: pressure   Pain quality comment:  Like a stitch Pain radiates to:  Does not radiate Pain severity:  Moderate Onset quality:  Sudden Duration: started 4 days ago off and on; this episode started at 10:30 am today. Timing:  Constant Progression:  Resolved (after aspirin) Chronicity:  New Context: at rest   Relieved by:  Aspirin Worsened by:  Nothing Associated symptoms: cough (sinus trouble) and shortness of breath   Associated symptoms: no abdominal pain, no back pain, no fever, no lower extremity edema, no palpitations and no vomiting   Risk factors: diabetes mellitus, high cholesterol, hypertension and male sex        Past Medical History:  Diagnosis Date  . Asthma   . GERD (gastroesophageal reflux disease)     Patient Active Problem List   Diagnosis Date Noted  . Type 2 diabetes mellitus without complications (HCC) 06/15/2018  . Hyperlipidemia associated with type 2 diabetes mellitus (HCC) 06/15/2018  . Well adult exam 11/16/2013  . Allergic rhinitis due to other allergen 03/23/2009  . GERD 03/23/2009    History reviewed. No pertinent surgical history.     Family History  Problem Relation Age of Onset  . Hypertension Father   . Stroke Neg Hx   . Alcohol abuse Neg Hx   . Cancer Neg Hx   . COPD Neg Hx   . Depression Neg Hx   . Diabetes Neg Hx   . Drug abuse Neg Hx   . Early death Neg Hx   . Hearing loss Neg Hx   . Heart disease Neg Hx   . Hyperlipidemia Neg Hx   . Kidney disease Neg Hx     Social History   Tobacco Use  . Smoking status: Never Smoker  . Smokeless tobacco: Never Used  .  Tobacco comment: Regular Exercise-Yes  Vaping Use  . Vaping Use: Never used  Substance Use Topics  . Alcohol use: No  . Drug use: No    Home Medications Prior to Admission medications   Medication Sig Start Date End Date Taking? Authorizing Provider  atorvastatin (LIPITOR) 40 MG tablet Take 1 tablet (40 mg total) by mouth daily. Patient not taking: Reported on 11/30/2020 09/26/20   Overton Mam, DO  cyclobenzaprine (FLEXERIL) 5 MG tablet Take by mouth. Patient not taking: Reported on 11/30/2020 11/29/20   [provider]  glucose blood (TRUE METRIX BLOOD GLUCOSE TEST) test strip Twice daily 09/27/20   Cirigliano, Jearld Lesch, DO  ibuprofen (ADVIL) 600 MG tablet Take 1 tablet (600 mg total) by mouth every 8 (eight) hours as needed. 11/30/20   Cirigliano, Jearld Lesch, DO  insulin isophane & regular human (NOVOLIN 70/30 FLEXPEN) (70-30) 100 UNIT/ML KwikPen Inject 10 Units into the skin 2 (two) times daily with a meal. 09/25/20   Nche, Bonna Gains, NP  Insulin Pen Needle (PEN NEEDLES) 31G X 6 MM MISC 1 application by Does not apply route in the morning and at bedtime. 09/25/20   Nche, Bonna Gains, NP  lisinopril (ZESTRIL) 2.5 MG tablet Take 1 tablet (2.5 mg total) by mouth daily.  09/26/20   Cirigliano, Jearld Lesch, DO  pioglitazone (ACTOS) 15 MG tablet Take 1 tablet (15 mg total) by mouth daily. 10/10/20   Overton Mam, DO    Allergies    Patient has no known allergies.  Review of Systems   Review of Systems  Constitutional: Negative for chills and fever.  HENT: Negative for ear pain and sore throat.   Eyes: Negative for pain and visual disturbance.  Respiratory: Positive for cough (sinus trouble) and shortness of breath.   Cardiovascular: Positive for chest pain. Negative for palpitations.  Gastrointestinal: Negative for abdominal pain and vomiting.  Genitourinary: Negative for dysuria and hematuria.  Musculoskeletal: Negative for arthralgias and back pain.  Skin: Negative for color  change and rash.  Neurological: Negative for seizures and syncope.  All other systems reviewed and are negative.   Physical Exam Updated Vital Signs BP 126/82   Pulse 83   Temp 97.9 F (36.6 C) (Oral)   Resp 17   SpO2 100%   Physical Exam Vitals and nursing note reviewed.  Constitutional:      Appearance: He is well-developed.  HENT:     Head: Normocephalic and atraumatic.  Eyes:     Conjunctiva/sclera: Conjunctivae normal.  Cardiovascular:     Rate and Rhythm: Normal rate and regular rhythm.     Heart sounds: No murmur heard.   Pulmonary:     Effort: Pulmonary effort is normal. No respiratory distress.     Breath sounds: Normal breath sounds.  Abdominal:     Palpations: Abdomen is soft.     Tenderness: There is no abdominal tenderness.  Musculoskeletal:     Cervical back: Neck supple.  Skin:    General: Skin is warm and dry.  Neurological:     General: No focal deficit present.     Mental Status: He is alert.  Psychiatric:        Mood and Affect: Mood normal.     ED Results / Procedures / Treatments   Labs (all labs ordered are listed, but only abnormal results are displayed) Labs Reviewed  BASIC METABOLIC PANEL  CBC  D-DIMER, QUANTITATIVE  TROPONIN I (HIGH SENSITIVITY)  TROPONIN I (HIGH SENSITIVITY)    EKG EKG Interpretation  Date/Time:  Wednesday Jan 09 2021 13:56:43 EDT Ventricular Rate:  83 PR Interval:  245 QRS Duration: 108 QT Interval:  380 QTC Calculation: 447 R Axis:   104 Text Interpretation: Sinus or ectopic atrial rhythm Prolonged PR interval Right axis deviation Minimal ST depression, inferior leads Borderline ST elevation, lateral leads no prior available for comparison Confirmed by Pieter Partridge ([id number]) on 01/09/2021 2:05:15 PM   Radiology DG Chest 2 View  Result Date: 01/09/2021 CLINICAL DATA:  Chest pain, shortness of breath, heart racing, cramping and LEFT side of chest, symptoms since Sunday, cough for 2 weeks, sinus drainage,  BILATERAL calf cramping off and on for 3-5 days, diabetes mellitus EXAM: CHEST - 2 VIEW COMPARISON:  None FINDINGS: Normal heart size, mediastinal contours, and pulmonary vascularity. Lungs clear. No pulmonary infiltrate, pleural effusion, or pneumothorax. Scattered endplate spur formation and biconvex scoliosis thoracic spine. IMPRESSION: No acute abnormalities. Electronically Signed   By: Ulyses Southward M.D.   On: 01/09/2021 15:04    Procedures Procedures   Medications Ordered in ED Medications - No data to display  ED Course  I have reviewed the triage vital signs and the nursing notes.  Pertinent labs & imaging results that were available during my care of the  patient were reviewed by me and considered in my medical decision making (see chart for details).    MDM Rules/Calculators/A&P                          Frank Roy has a history of hypertension, hyperlipidemia, diet-controlled diabetes.  He presents with palpitations and chest pain.  He was evaluated and risk stratified according to the HEART pathway.  I have also suspicious for possible atrial fibrillation given his reported tachycardia and palpitations.  He will follow with his primary care doctor and possibly cardiology for further care.  He is currently awaiting a second troponin.  Otherwise, his ED work-up was within normal limits and was not suggestive of PE, pneumonia, or other acute cardiopulmonary emergency. Final Clinical Impression(s) / ED Diagnoses Final diagnoses:  Chest pain, unspecified type  Palpitations    Rx / DC Orders ED Discharge Orders    None       Koleen Distance, MD 01/09/21 1622

## 2021-01-09 NOTE — ED Notes (Signed)
All appropriate discharge materials reviewed at length with patient. Time for questions provided. Pt has no other questions at this time and verbalizes understanding of all provided materials.  

## 2021-01-10 ENCOUNTER — Encounter: Payer: Self-pay | Admitting: Family Medicine

## 2021-01-16 ENCOUNTER — Encounter: Payer: Self-pay | Admitting: Family Medicine

## 2021-01-16 ENCOUNTER — Other Ambulatory Visit: Payer: Self-pay | Admitting: Nurse Practitioner

## 2021-01-16 DIAGNOSIS — E1165 Type 2 diabetes mellitus with hyperglycemia: Secondary | ICD-10-CM

## 2021-01-16 NOTE — Telephone Encounter (Signed)
Pt called and said he is out of the needles and asked if they can be sent in today

## 2021-01-16 NOTE — Telephone Encounter (Signed)
Needles have been sent. 

## 2021-01-18 ENCOUNTER — Inpatient Hospital Stay: Payer: Self-pay | Admitting: Family Medicine

## 2021-01-21 ENCOUNTER — Ambulatory Visit (INDEPENDENT_AMBULATORY_CARE_PROVIDER_SITE_OTHER): Payer: PRIVATE HEALTH INSURANCE | Admitting: Family Medicine

## 2021-01-21 ENCOUNTER — Encounter: Payer: Self-pay | Admitting: Family Medicine

## 2021-01-21 ENCOUNTER — Other Ambulatory Visit: Payer: Self-pay

## 2021-01-21 VITALS — BP 120/82 | HR 68 | Temp 97.7°F | Ht 71.0 in | Wt 201.0 lb

## 2021-01-21 DIAGNOSIS — R002 Palpitations: Secondary | ICD-10-CM | POA: Insufficient documentation

## 2021-01-21 HISTORY — DX: Palpitations: R00.2

## 2021-01-21 NOTE — Progress Notes (Signed)
Established Patient Office Visit  Subjective:  Patient ID: Frank Roy, male    DOB: 1961-07-12  Age: 60 y.o. MRN: [id number]  CC:  Chief Complaint  Patient presents with  . Hospitalization Follow-up    Pt of Dr. Renaye Rakers.  Pt c/o feeling soreness in the middle of his chest.      HPI Frank Roy presents for follow-up of a hospital encounter back on the left leg.  He had been seen for heart palpitations.  He tells that he is felt these over the last few months.  They come and go and may be associated with some shortness of breath and mild chest pain.  Risk factors include poorly controlled diabetes.  Last hemoglobin A1c was 11.6.  Sugars have been doing better recently he says.  Fasting sugars have been less than 120 and greater than 100.  Last LDL cholesterol on 40 of atorvastatin was 114.  He takes a renal protective dose of lisinopril.  Blood pressure today was 120/82.  He does not smoke or use illicit drugs.  He does drink alcohol on occasion for "medicinal purposes".  He had heard that this could be used to lower his blood sugars.  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  Past Medical History:  Diagnosis Date  . Asthma   . GERD (gastroesophageal reflux disease)     History reviewed. No pertinent surgical history.  Family History  Problem Relation Age of Onset  . Hypertension Father   . Stroke Neg Hx   . Alcohol abuse Neg Hx   . Cancer Neg Hx   . COPD Neg Hx   . Depression Neg Hx   . Diabetes Neg Hx   . Drug abuse Neg Hx   . Early death Neg Hx   . Hearing loss Neg Hx   . Heart disease Neg Hx   . Hyperlipidemia Neg Hx   . Kidney disease Neg Hx     Social History   Socioeconomic History  . Marital status: Married    Spouse name: Not on file  . Number of children: Not on file  . Years of education: Not on file  . Highest education level: Not on file  Occupational History  . Not on file  Tobacco  Use  . Smoking status: Never Smoker  . Smokeless tobacco: Never Used  . Tobacco comment: Regular Exercise-Yes  Vaping Use  . Vaping Use: Never used  Substance and Sexual Activity  . Alcohol use: No  . Drug use: No  . Sexual activity: Yes  Other Topics Concern  . Not on file  Social History Narrative  . Not on file   Social Determinants of Health   Financial Resource Strain: Not on file  Food Insecurity: Not on file  Transportation Needs: Not on file  Physical Activity: Not on file  Stress: Not on file  Social Connections: Not on file  Intimate Partner Violence: Not on file    Outpatient Medications Prior to Visit  Medication Sig Dispense Refill  . atorvastatin (LIPITOR) 40 MG tablet Take 1 tablet (40 mg total) by mouth daily. 90 tablet 3  . BD PEN NEEDLE MICRO U/F 32G X 6 MM MISC USE TO INJECT INSULIN UNDER THE SKIN IN THE MORNING AND AT BEDTIME 100 each 1  . cyclobenzaprine (FLEXERIL) 5 MG tablet Take by mouth.    Marland Kitchen glucose blood (TRUE METRIX BLOOD GLUCOSE TEST) test strip Twice  daily 100 each 12  . ibuprofen (ADVIL) 600 MG tablet Take 1 tablet (600 mg total) by mouth every 8 (eight) hours as needed. 60 tablet 0  . insulin isophane & regular human (NOVOLIN 70/30 FLEXPEN) (70-30) 100 UNIT/ML KwikPen Inject 10 Units into the skin 2 (two) times daily with a meal. 15 mL 1  . lisinopril (ZESTRIL) 2.5 MG tablet Take 1 tablet (2.5 mg total) by mouth daily. 90 tablet 3  . pioglitazone (ACTOS) 15 MG tablet Take 1 tablet (15 mg total) by mouth daily. 90 tablet 1   No facility-administered medications prior to visit.    No Known Allergies  ROS Review of Systems  Constitutional: Negative.  Negative for diaphoresis.  HENT: Negative.   Eyes: Negative for photophobia and visual disturbance.  Respiratory: Negative.  Negative for chest tightness, shortness of breath and wheezing.   Cardiovascular: Positive for palpitations.  Gastrointestinal: Negative.  Negative for nausea and  vomiting.  Genitourinary: Negative.   Neurological: Negative for weakness.      Objective:    Physical Exam Vitals and nursing note reviewed.  Constitutional:      General: He is not in acute distress.    Appearance: Normal appearance. He is not ill-appearing, toxic-appearing or diaphoretic.  HENT:     Head: Normocephalic and atraumatic.     Right Ear: External ear normal.     Left Ear: External ear normal.     Mouth/Throat:     Mouth: Mucous membranes are moist.     Pharynx: Oropharynx is clear. No oropharyngeal exudate or posterior oropharyngeal erythema.  Eyes:     General: No scleral icterus.       Right eye: No discharge.        Left eye: No discharge.     Extraocular Movements: Extraocular movements intact.     Conjunctiva/sclera: Conjunctivae normal.     Pupils: Pupils are equal, round, and reactive to light.  Neck:     Vascular: No carotid bruit.  Cardiovascular:     Rate and Rhythm: Normal rate and regular rhythm.  Pulmonary:     Effort: Pulmonary effort is normal.     Breath sounds: Normal breath sounds.  Abdominal:     General: Bowel sounds are normal.  Musculoskeletal:     Cervical back: No rigidity or tenderness.  Lymphadenopathy:     Cervical: No cervical adenopathy.  Skin:    General: Skin is warm and dry.  Neurological:     Mental Status: He is alert and oriented to person, place, and time.  Psychiatric:        Mood and Affect: Mood normal.        Behavior: Behavior normal.     BP 120/82 (BP Location: Left Arm, Patient Position: Sitting, Cuff Size: Large)   Pulse 68   Temp 97.7 F (36.5 C) (Oral)   Ht 5\' 11"  (1.803 m)   Wt 201 lb (91.2 kg)   SpO2 98%   BMI 28.03 kg/m  Wt Readings from Last 3 Encounters:  01/21/21 201 lb (91.2 kg)  11/30/20 195 lb 12.8 oz (88.8 kg)  10/10/20 193 lb 3.2 oz (87.6 kg)     Health Maintenance Due  Topic Date Due  . FOOT EXAM  Never done  . OPHTHALMOLOGY EXAM  Never done  . HIV Screening  Never done  .  Hepatitis C Screening  Never done  . COLONOSCOPY (Pts 45-41yrs Insurance coverage will need to be confirmed)  06/13/2020  There are no preventive care reminders to display for this patient.  Lab Results  Component Value Date   TSH 2.54 06/04/2018   Lab Results  Component Value Date   WBC 7.9 01/09/2021   HGB 13.9 01/09/2021   HCT 42.1 01/09/2021   MCV 87.9 01/09/2021   PLT 236 01/09/2021   Lab Results  Component Value Date   NA 139 01/09/2021   K 3.9 01/09/2021   CO2 23 01/09/2021   GLUCOSE 78 01/09/2021   BUN 17 01/09/2021   CREATININE 1.06 01/09/2021   BILITOT 0.5 09/20/2020   ALKPHOS 131 (H) 09/20/2020   AST 14 09/20/2020   ALT 16 09/20/2020   PROT 7.8 09/20/2020   ALBUMIN 4.4 09/20/2020   CALCIUM 9.3 01/09/2021   ANIONGAP 6 01/09/2021   GFR 66.90 09/20/2020   Lab Results  Component Value Date   CHOL 190 09/20/2020   Lab Results  Component Value Date   HDL 44.30 09/20/2020   Lab Results  Component Value Date   LDLCALC 114 (H) 09/20/2020   Lab Results  Component Value Date   TRIG 160.0 (H) 09/20/2020   Lab Results  Component Value Date   CHOLHDL 4 09/20/2020   Lab Results  Component Value Date   HGBA1C 11.6 (H) 09/20/2020      Assessment & Plan:   Problem List Items Addressed This Visit      Other   Palpitations - Primary   Relevant Orders   Ambulatory referral to Cardiology      No orders of the defined types were placed in this encounter.   Follow-up: Return Follow up in June for scheduled diabetes check..  Advised patient that alcohol does not lower blood sugar and that no amount of alcohol is considered to be safe for the heart.  Cardiology referral for Holter monitor placement.  Follow back up in June for recheck of diabetes.  Would like to see LDL lower.  Hopefully hemoglobin A1c will be much improved.  Mliss Sax, MD

## 2021-02-04 DIAGNOSIS — K219 Gastro-esophageal reflux disease without esophagitis: Secondary | ICD-10-CM | POA: Insufficient documentation

## 2021-02-07 ENCOUNTER — Encounter: Payer: Self-pay | Admitting: Family Medicine

## 2021-02-08 ENCOUNTER — Ambulatory Visit (INDEPENDENT_AMBULATORY_CARE_PROVIDER_SITE_OTHER): Payer: PRIVATE HEALTH INSURANCE | Admitting: Nurse Practitioner

## 2021-02-08 ENCOUNTER — Encounter: Payer: Self-pay | Admitting: Nurse Practitioner

## 2021-02-08 ENCOUNTER — Other Ambulatory Visit: Payer: Self-pay

## 2021-02-08 VITALS — BP 138/80 | HR 69 | Temp 98.2°F | Ht 71.0 in | Wt 203.2 lb

## 2021-02-08 DIAGNOSIS — T783XXA Angioneurotic edema, initial encounter: Secondary | ICD-10-CM

## 2021-02-08 MED ORDER — CETIRIZINE HCL 10 MG PO TABS: 10.0000 mg | ORAL_TABLET | Freq: Every day | ORAL | 0 refills | Status: AC

## 2021-02-08 NOTE — Patient Instructions (Addendum)
Stop lisinopril and ibuprofen Monitor BP at home in AM, call office if BP >140/80 Start zyrtec 10mg  a bedtime

## 2021-02-08 NOTE — Progress Notes (Signed)
Subjective:  Patient ID: Frank Roy, male    DOB: Mar 03, 1961  Age: 60 y.o. MRN: [id number]  CC: Eye Problem (Swelling on forehead that spread to right eye started 5 days ago swelling have come down. There was no pains. Patient not sure if this was a reaction to seafood or possible bug bite. )  Rash This is a new problem. The current episode started in the past 7 days. The problem is unchanged. The affected locations include the face. The rash is characterized by swelling. It is unknown if there was an exposure to a precipitant. Associated symptoms include facial edema. Pertinent negatives include no eye pain, fatigue, rhinorrhea, shortness of breath or sore throat. Past treatments include nothing.   No problem-specific Assessment & Plan notes found for this encounter. BP Readings from Last 3 Encounters:  02/08/21 138/80  01/21/21 120/82  01/09/21 123/71    Reviewed past Medical, Social and Family history today.  Outpatient Medications Prior to Visit  Medication Sig Dispense Refill   atorvastatin (LIPITOR) 40 MG tablet Take 1 tablet (40 mg total) by mouth daily. 90 tablet 3   BD PEN NEEDLE MICRO U/F 32G X 6 MM MISC USE TO INJECT INSULIN UNDER THE SKIN IN THE MORNING AND AT BEDTIME 100 each 1   glucose blood (TRUE METRIX BLOOD GLUCOSE TEST) test strip Twice daily 100 each 12   insulin isophane & regular human (NOVOLIN 70/30 FLEXPEN) (70-30) 100 UNIT/ML KwikPen Inject 10 Units into the skin 2 (two) times daily with a meal. 15 mL 1   pioglitazone (ACTOS) 15 MG tablet Take 1 tablet (15 mg total) by mouth daily. 90 tablet 1   ibuprofen (ADVIL) 600 MG tablet Take 1 tablet (600 mg total) by mouth every 8 (eight) hours as needed. 60 tablet 0   lisinopril (ZESTRIL) 2.5 MG tablet Take 1 tablet (2.5 mg total) by mouth daily. 90 tablet 3   cyclobenzaprine (FLEXERIL) 5 MG tablet Take by mouth. (Patient not taking: Reported on 02/08/2021)     No facility-administered medications prior to visit.     ROS See HPI  Objective:  BP 138/80 (BP Location: Left Arm, Patient Position: Sitting, Cuff Size: Normal)   Pulse 69   Temp 98.2 F (36.8 C) (Temporal)   Ht 5\' 11"  (1.803 m)   Wt 203 lb 3.2 oz (92.2 kg)   SpO2 96%   BMI 28.34 kg/m   Physical Exam Vitals reviewed.  HENT:     Head:     Jaw: There is normal jaw occlusion.     Right Ear: External ear normal.     Left Ear: External ear normal.     Nose: Nose normal.  Eyes:     General:        Right eye: No discharge or hordeolum.        Left eye: No discharge or hordeolum.     Extraocular Movements: Extraocular movements intact.     Conjunctiva/sclera: Conjunctivae normal.     Comments: Periorbital swelling  Cardiovascular:     Rate and Rhythm: Normal rate.     Pulses: Normal pulses.  Pulmonary:     Effort: Pulmonary effort is normal.  Neurological:     Mental Status: He is alert.   Assessment & Plan:  This visit occurred during the SARS-CoV-2 public health emergency.  Safety protocols were in place, including screening questions prior to the visit, additional usage of staff PPE, and extensive cleaning of exam room while observing appropriate contact  time as indicated for disinfecting solutions.   Kasch was seen today for eye problem.  Diagnoses and all orders for this visit:  Angioedema, initial encounter -     cetirizine (ZYRTEC) 10 MG tablet; Take 1 tablet (10 mg total) by mouth at bedtime. Stop lisinopril and ibuprofen Monitor BP at home in AM, call office if BP >140/80 Start zyrtec 10mg  a bedtime.  Problem List Items Addressed This Visit   None Visit Diagnoses     Angioedema, initial encounter    -  Primary   Relevant Medications   cetirizine (ZYRTEC) 10 MG tablet       Follow-up: Return in about 2 weeks (around 02/22/2021) for angioedema and BP check.  02/24/2021, NP

## 2021-02-09 ENCOUNTER — Encounter: Payer: Self-pay | Admitting: Nurse Practitioner

## 2021-02-18 ENCOUNTER — Ambulatory Visit (INDEPENDENT_AMBULATORY_CARE_PROVIDER_SITE_OTHER): Payer: PRIVATE HEALTH INSURANCE | Admitting: Cardiology

## 2021-02-18 ENCOUNTER — Encounter: Payer: Self-pay | Admitting: Cardiology

## 2021-02-18 ENCOUNTER — Ambulatory Visit (INDEPENDENT_AMBULATORY_CARE_PROVIDER_SITE_OTHER): Payer: PRIVATE HEALTH INSURANCE

## 2021-02-18 ENCOUNTER — Other Ambulatory Visit: Payer: Self-pay

## 2021-02-18 VITALS — BP 138/90 | HR 60 | Ht 71.0 in | Wt 203.0 lb

## 2021-02-18 DIAGNOSIS — R002 Palpitations: Secondary | ICD-10-CM

## 2021-02-18 DIAGNOSIS — E1169 Type 2 diabetes mellitus with other specified complication: Secondary | ICD-10-CM

## 2021-02-18 DIAGNOSIS — Z794 Long term (current) use of insulin: Secondary | ICD-10-CM

## 2021-02-18 DIAGNOSIS — E119 Type 2 diabetes mellitus without complications: Secondary | ICD-10-CM | POA: Diagnosis not present

## 2021-02-18 DIAGNOSIS — K219 Gastro-esophageal reflux disease without esophagitis: Secondary | ICD-10-CM | POA: Diagnosis not present

## 2021-02-18 DIAGNOSIS — E785 Hyperlipidemia, unspecified: Secondary | ICD-10-CM

## 2021-02-18 NOTE — Progress Notes (Signed)
Cardiology Consultation:    Date:  02/18/2021   ID:  Frank Roy, DOB Jun 07, 1961, MRN [id number]  PCP:  Overton Mam, DO  Cardiologist:  Gypsy Balsam, MD   Referring MD: Overton Mam, DO   Chief Complaint  Patient presents with   Chest Pain    History of Present Illness:    Frank Roy is a 60 y.o. male who is being seen today for the evaluation of palpitations at the request of Overton Mam, DO.  Past medical history significant for recently recognized diabetes now he is on insulin, dyslipidemia.  He works as a Automotive engineer and travels a lot.  Few months ago he started experiencing palpitations.  What he means by that he felt his heart skipping and sometimes fluttering.  There was also associated with some uneasy sensation in the chest.  However all the sensation gone now.  He is completely asymptomatic.  Denies have any chest pain tightness squeezing pressure mid chest.  He is very active but admits not to exercise on the regular basis.  There is no dizziness no's passing out.  He does not have any family history of premature coronary artery disease.  He changed his diet focusing on better control of diabetes.  Which means that he is trying to use high-protein low carbs diet. He never smoke He does not drink There is no family history of premature cardiac death  Past Medical History:  Diagnosis Date   Allergic rhinitis due to other allergen 03/23/2009   Qualifier: Diagnosis of  By: Yetta Barre MD, Bernadene Bell.    Asthma    GERD 03/23/2009   Qualifier: Diagnosis of  By: Yetta Barre MD, Bernadene Bell.    GERD (gastroesophageal reflux disease)    Hyperlipidemia associated with type 2 diabetes mellitus (HCC) 06/15/2018   Palpitations 01/21/2021   Type 2 diabetes mellitus without complications (HCC) 06/15/2018   Well adult exam 11/16/2013    History reviewed. No pertinent surgical history.  Current Medications: Current Meds  Medication Sig   atorvastatin (LIPITOR) 40 MG tablet  Take 1 tablet (40 mg total) by mouth daily.   BD PEN NEEDLE MICRO U/F 32G X 6 MM MISC USE TO INJECT INSULIN UNDER THE SKIN IN THE MORNING AND AT BEDTIME   cetirizine (ZYRTEC) 10 MG tablet Take 1 tablet (10 mg total) by mouth at bedtime.   glucose blood (TRUE METRIX BLOOD GLUCOSE TEST) test strip Twice daily   insulin isophane & regular human (NOVOLIN 70/30 FLEXPEN) (70-30) 100 UNIT/ML KwikPen Inject 10 Units into the skin 2 (two) times daily with a meal.   pioglitazone (ACTOS) 15 MG tablet Take 1 tablet (15 mg total) by mouth daily.     Allergies:   Patient has no known allergies.   Social History   Socioeconomic History   Marital status: Married    Spouse name: Not on file   Number of children: Not on file   Years of education: Not on file   Highest education level: Not on file  Occupational History   Not on file  Tobacco Use   Smoking status: Never   Smokeless tobacco: Never   Tobacco comments:    Regular Exercise-Yes  Vaping Use   Vaping Use: Never used  Substance and Sexual Activity   Alcohol use: No   Drug use: No   Sexual activity: Yes  Other Topics Concern   Not on file  Social History Narrative   Not on file   Social Determinants  of Health   Financial Resource Strain: Not on file  Food Insecurity: Not on file  Transportation Needs: Not on file  Physical Activity: Not on file  Stress: Not on file  Social Connections: Not on file     Family History: The patient's family history includes Hypertension in his father. There is no history of Stroke, Alcohol abuse, Cancer, COPD, Depression, Diabetes, Drug abuse, Early death, Hearing loss, Heart disease, Hyperlipidemia, or Kidney disease. ROS:   Please see the history of present illness.    All 14 point review of systems negative except as described per history of present illness.  EKGs/Labs/Other Studies Reviewed:    The following studies were reviewed today:   EKG:  EKG is  ordered today.  The ekg ordered  today demonstrates normal sinus rhyth, nonspecific intraventricular conduction delay.  Nonspecific ST segment changes  Recent Labs: 09/20/2020: ALT 16 01/09/2021: BUN 17; Creatinine, Ser 1.06; Hemoglobin 13.9; Platelets 236; Potassium 3.9; Sodium 139  Recent Lipid Panel    Component Value Date/Time   CHOL 190 09/20/2020 0812   TRIG 160.0 (H) 09/20/2020 0812   HDL 44.30 09/20/2020 0812   CHOLHDL 4 09/20/2020 0812   VLDL 32.0 09/20/2020 0812   LDLCALC 114 (H) 09/20/2020 0812   LDLDIRECT 158.5 04/18/2010 0905    Physical Exam:    VS:  BP 138/90 (BP Location: Left Arm, Patient Position: Sitting, Cuff Size: Normal)   Pulse 60   Ht 5\' 11"  (1.803 m)   Wt 203 lb (92.1 kg)   SpO2 94%   BMI 28.31 kg/m     Wt Readings from Last 3 Encounters:  02/18/21 203 lb (92.1 kg)  02/08/21 203 lb 3.2 oz (92.2 kg)  01/21/21 201 lb (91.2 kg)     GEN:  Well nourished, well developed in no acute distress HEENT: Normal NECK: No JVD; No carotid bruits LYMPHATICS: No lymphadenopathy CARDIAC: RRR, no murmurs, no rubs, no gallops RESPIRATORY:  Clear to auscultation without rales, wheezing or rhonchi  ABDOMEN: Soft, non-tender, non-distended MUSCULOSKELETAL:  No edema; No deformity  SKIN: Warm and dry NEUROLOGIC:  Alert and oriented x 3 PSYCHIATRIC:  Normal affect   ASSESSMENT:    1. Palpitations   2. Hyperlipidemia associated with type 2 diabetes mellitus (HCC)   3. Type 2 diabetes mellitus without complication, with long-term current use of insulin (HCC)   4. Gastroesophageal reflux disease, unspecified whether esophagitis present    PLAN:    In order of problems listed above:  Palpitations: I will ask him to have echocardiogram to assess left ventricle ejection fraction make sure structurally heart is normal.  He will also wear a Zio patch for a month.  He does have apple watch that he actually got yesterday and that apple watch allowed him to record EKG.  I told him if monitor is unrevealing  continue to record EKG anytime he feels some palpitations.  Overall he is doing quite well he is able to exercise and do aggressive workouts with no major difficulties.  I think the key right now is risk factors modifications Dyslipidemia: He has been put on high intense statin he is taking Lipitor 40.  I did review his K PN which showed me his data from September 20, 2020 with LDL 114 HDL 44.  Obviously that need to be better controlled.  Later we will make arrangements for have fasting lipid profile to be repeated. Type 2 diabetes he scheduled to see his primary care physician and hemoglobin A1c  will be done as well.  I talked to him about usefulness of continuous glucose monitor and controlling with diet. Gastroesophageal reflux disease denies having any now History of chest pain denies having any now. We did talk about healthy lifestyle need to exercise on the regular basis as well as good diet.   Medication Adjustments/Labs and Tests Ordered: Current medicines are reviewed at length with the patient today.  Concerns regarding medicines are outlined above.  No orders of the defined types were placed in this encounter.  No orders of the defined types were placed in this encounter.   Signed, Georgeanna Lea, MD, [facility]. 02/18/2021 10:15 AM    [facility]

## 2021-02-18 NOTE — Patient Instructions (Signed)
Medication Instructions:  Your physician recommends that you continue on your current medications as directed. Please refer to the Current Medication list given to you today.  *If you need a refill on your cardiac medications before your next appointment, please call your pharmacy*   Lab Work: None If you have labs (blood work) drawn today and your tests are completely normal, you will receive your results only by: MyChart Message (if you have MyChart) OR A paper copy in the mail If you have any lab test that is abnormal or we need to change your treatment, we will call you to review the results.   Testing/Procedures: A zio monitor was ordered today. It will remain on for 7 days. You will then return monitor and event diary in provided box. It takes 1-2 weeks for report to be downloaded and returned to us. We will call you with the results. If monitor falls off or has orange flashing light, please call Zio for further instructions.   Your physician has requested that you have an echocardiogram. Echocardiography is a painless test that uses sound waves to create images of your heart. It provides your doctor with information about the size and shape of your heart and how well your heart's chambers and valves are working. This procedure takes approximately one hour. There are no restrictions for this procedure.    Follow-Up: At [facility], you and your health needs are our priority.  As part of our continuing mission to provide you with exceptional heart care, we have created designated Provider Care Teams.  These Care Teams include your primary Cardiologist (physician) and Advanced Practice Providers (APPs -  Physician Assistants and Nurse Practitioners) who all work together to provide you with the care you need, when you need it.  We recommend signing up for the patient portal called "MyChart".  Sign up information is provided on this After Visit Summary.  MyChart is used to connect with  patients for Virtual Visits (Telemedicine).  Patients are able to view lab/test results, encounter notes, upcoming appointments, etc.  Non-urgent messages can be sent to your provider as well.   To learn more about what you can do with MyChart, go to [email or web address].    Your next appointment:   3 month(s)  The format for your next appointment:   In Person  Provider:   Robert Krasowski, MD   Other Instructions  Echocardiogram An echocardiogram is a test that uses sound waves (ultrasound) to produce images of the heart. Images from an echocardiogram can provide important information about: Heart size and shape. The size and thickness and movement of your heart's walls. Heart muscle function and strength. Heart valve function or if you have stenosis. Stenosis is when the heart valves are too narrow. If blood is flowing backward through the heart valves (regurgitation). A tumor or infectious growth around the heart valves. Areas of heart muscle that are not working well because of poor blood flow or injury from a heart attack. Aneurysm detection. An aneurysm is a weak or damaged part of an artery wall. The wall bulges out from the normal force of blood pumping through the body. Tell a health care provider about: Any allergies you have. All medicines you are taking, including vitamins, herbs, eye drops, creams, and over-the-counter medicines. Any blood disorders you have. Any surgeries you have had. Any medical conditions you have. Whether you are pregnant or may be pregnant. What are the risks? Generally, this is a safe test. However, problems   may occur, including an allergic reaction to dye (contrast) that may be used during the test. What happens before the test? No specific preparation is needed. You may eat and drink normally. What happens during the test?  You will take off your clothes from the waist up and put on a hospital gown. Electrodes or electrocardiogram  (ECG)patches may be placed on your chest. The electrodes or patches are then connected to a device that monitors your heart rate and rhythm. You will lie down on a table for an ultrasound exam. A gel will be applied to your chest to help sound waves pass through your skin. A handheld device, called a transducer, will be pressed against your chest and moved over your heart. The transducer produces sound waves that travel to your heart and bounce back (or "echo" back) to the transducer. These sound waves will be captured in real-time and changed into images of your heart that can be viewed on a video monitor. The images will be recorded on a computer and reviewed by your health care provider. You may be asked to change positions or hold your breath for a short time. This makes it easier to get different views or better views of your heart. In some cases, you may receive contrast through an IV in one of your veins. This can improve the quality of the pictures from your heart. The procedure may vary among health care providers and hospitals. What can I expect after the test? You may return to your normal, everyday life, including diet, activities, and medicines, unless your health care provider tells you not to do that. Follow these instructions at home: It is up to you to get the results of your test. Ask your health care provider, or the department that is doing the test, when your results will be ready. Keep all follow-up visits. This is important. Summary An echocardiogram is a test that uses sound waves (ultrasound) to produce images of the heart. Images from an echocardiogram can provide important information about the size and shape of your heart, heart muscle function, heart valve function, and other possible heart problems. You do not need to do anything to prepare before this test. You may eat and drink normally. After the echocardiogram is completed, you may return to your normal, everyday  life, unless your health care provider tells you not to do that. This information is not intended to replace advice given to you by your health care provider. Make sure you discuss any questions you have with your health care provider. Document Revised: 04/10/2020 Document Reviewed: 04/10/2020 Elsevier Patient Education  2022 Elsevier Inc.   

## 2021-02-21 ENCOUNTER — Ambulatory Visit: Payer: PRIVATE HEALTH INSURANCE | Admitting: Nurse Practitioner

## 2021-02-22 ENCOUNTER — Ambulatory Visit: Payer: PRIVATE HEALTH INSURANCE | Admitting: Family Medicine

## 2021-03-11 ENCOUNTER — Other Ambulatory Visit: Payer: Self-pay | Admitting: Nurse Practitioner

## 2021-03-11 ENCOUNTER — Other Ambulatory Visit: Payer: Self-pay

## 2021-03-11 ENCOUNTER — Ambulatory Visit (HOSPITAL_COMMUNITY): Payer: 59 | Attending: Cardiology

## 2021-03-11 DIAGNOSIS — E1165 Type 2 diabetes mellitus with hyperglycemia: Secondary | ICD-10-CM

## 2021-03-11 DIAGNOSIS — E1169 Type 2 diabetes mellitus with other specified complication: Secondary | ICD-10-CM | POA: Diagnosis present

## 2021-03-11 DIAGNOSIS — E119 Type 2 diabetes mellitus without complications: Secondary | ICD-10-CM | POA: Diagnosis present

## 2021-03-11 DIAGNOSIS — Z794 Long term (current) use of insulin: Secondary | ICD-10-CM | POA: Diagnosis present

## 2021-03-11 DIAGNOSIS — R002 Palpitations: Secondary | ICD-10-CM | POA: Insufficient documentation

## 2021-03-11 DIAGNOSIS — E785 Hyperlipidemia, unspecified: Secondary | ICD-10-CM | POA: Diagnosis present

## 2021-03-11 DIAGNOSIS — K219 Gastro-esophageal reflux disease without esophagitis: Secondary | ICD-10-CM | POA: Diagnosis present

## 2021-03-11 LAB — ECHOCARDIOGRAM COMPLETE
Area-P 1/2: 3.21 cm2
S' Lateral: 3.6 cm

## 2021-03-13 NOTE — Telephone Encounter (Signed)
Chart supports Rx Last OV 10/10/20 Next OV 03/21/21

## 2021-03-21 ENCOUNTER — Ambulatory Visit: Payer: PRIVATE HEALTH INSURANCE | Admitting: Nurse Practitioner

## 2021-04-08 ENCOUNTER — Other Ambulatory Visit: Payer: Self-pay | Admitting: Nurse Practitioner

## 2021-04-08 ENCOUNTER — Other Ambulatory Visit: Payer: PRIVATE HEALTH INSURANCE

## 2021-04-08 ENCOUNTER — Telehealth (INDEPENDENT_AMBULATORY_CARE_PROVIDER_SITE_OTHER): Payer: PRIVATE HEALTH INSURANCE | Admitting: Nurse Practitioner

## 2021-04-08 ENCOUNTER — Encounter: Payer: Self-pay | Admitting: Nurse Practitioner

## 2021-04-08 ENCOUNTER — Other Ambulatory Visit: Payer: Self-pay

## 2021-04-08 VITALS — Ht 70.0 in | Wt 203.0 lb

## 2021-04-08 DIAGNOSIS — E119 Type 2 diabetes mellitus without complications: Secondary | ICD-10-CM

## 2021-04-08 DIAGNOSIS — E1169 Type 2 diabetes mellitus with other specified complication: Secondary | ICD-10-CM

## 2021-04-08 DIAGNOSIS — E785 Hyperlipidemia, unspecified: Secondary | ICD-10-CM

## 2021-04-08 DIAGNOSIS — Z794 Long term (current) use of insulin: Secondary | ICD-10-CM

## 2021-04-08 LAB — LIPID PANEL
Cholesterol: 232 mg/dL — ABNORMAL HIGH (ref 0–200)
HDL: 64.1 mg/dL (ref >39.00)
LDL Cholesterol: 144 mg/dL — ABNORMAL HIGH (ref 0–99)
NonHDL: 168.05
Total CHOL/HDL Ratio: 4
Triglycerides: 121 mg/dL (ref 0.0–149.0)
VLDL: 24.2 mg/dL (ref 0.0–40.0)

## 2021-04-08 LAB — BASIC METABOLIC PANEL
BUN: 11 mg/dL (ref 6–23)
CO2: 30 mEq/L (ref 19–32)
Calcium: 9.3 mg/dL (ref 8.4–10.5)
Chloride: 102 mEq/L (ref 96–112)
Creatinine, Ser: 1.08 mg/dL (ref 0.40–1.50)
GFR: 74.12 mL/min (ref >60.00)
Glucose, Bld: 133 mg/dL — ABNORMAL HIGH (ref 70–99)
Potassium: 4.1 mEq/L (ref 3.5–5.1)
Sodium: 138 mEq/L (ref 135–145)

## 2021-04-08 LAB — HEPATIC FUNCTION PANEL
ALT: 11 U/L (ref 0–53)
AST: 14 U/L (ref 0–37)
Albumin: 4 g/dL (ref 3.5–5.2)
Alkaline Phosphatase: 102 U/L (ref 39–117)
Bilirubin, Direct: 0.1 mg/dL (ref 0.0–0.3)
Total Bilirubin: 0.4 mg/dL (ref 0.2–1.2)
Total Protein: 7.2 g/dL (ref 6.0–8.3)

## 2021-04-08 LAB — MICROALBUMIN / CREATININE URINE RATIO
Creatinine,U: 144.8 mg/dL
Microalb Creat Ratio: 1.5 mg/g (ref 0.0–30.0)
Microalb, Ur: 2.1 mg/dL — ABNORMAL HIGH (ref 0.0–1.9)

## 2021-04-08 LAB — HEMOGLOBIN A1C: Hgb A1c MFr Bld: 6.9 % — ABNORMAL HIGH (ref 4.6–6.5)

## 2021-04-08 NOTE — Assessment & Plan Note (Addendum)
Improved glucose control with actos and insulin Reports average fasting glucose reading of 122 in last 90days. Denies any hypoglycemic symptoms. Positive urine microalbumin but developed angioedema with lisinopril 2.5mg  BP at goal  Repeat hgbA1c: improved11.6 to 6.9% D/C novolin, continue actos F/up in 36months

## 2021-04-08 NOTE — Progress Notes (Signed)
Virtual Visit via Video Note  I connected withNAME@ on 04/09/21 at  8:30 AM EDT by a video enabled telemedicine application and verified that I am speaking with the correct person using two identifiers.  Location: Patient:Home Provider: Office Participants: patient and provider  I discussed the limitations of evaluation and management by telemedicine and the availability of in person appointments. I also discussed with the patient that there may be a patient responsible charge related to this service. The patient expressed understanding and agreed to proceed.  CC:HTN and DM f/up  History of Present Illness: Hyperlipidemia associated with type 2 diabetes mellitus (HCC) Repeat lipid panel: LDL not at goal Admits he is not taking atorvastatin as prescribed Advised to resume medication. Repeat lipid panel in 1months.  DM (diabetes mellitus) (HCC) Improved glucose control with actos and insulin Reports average fasting glucose reading of 122 in last 90days. Denies any hypoglycemic symptoms. Positive urine microalbumin but developed angioedema with lisinopril 2.5mg  BP at goal  Repeat hgbA1c: improved11.6 to 6.9% D/C novolin, continue actos F/up in 73months    Observations/Objective: Alert and oriented x 4, normal speech  Assessment and Plan: Leeroy was seen today for follow-up.  Diagnoses and all orders for this visit:  Type 2 diabetes mellitus with other specified complication, with long-term current use of insulin (HCC) -     Hemoglobin A1c -     Basic metabolic panel -     Microalbumin / creatinine urine ratio  Hyperlipidemia associated with type 2 diabetes mellitus (HCC) -     Lipid panel -     Hepatic function panel  Type 2 diabetes mellitus without complication, with long-term current use of insulin (HCC) -     pioglitazone (ACTOS) 15 MG tablet; Take 1 tablet (15 mg total) by mouth daily.   Follow Up Instructions: See above   I discussed the assessment and  treatment plan with the patient. The patient was provided an opportunity to ask questions and all were answered. The patient agreed with the plan and demonstrated an understanding of the instructions.   The patient was advised to call back or seek an in-person evaluation if the symptoms worsen or if the condition fails to improve as anticipated.  Alysia Penna, NP

## 2021-04-08 NOTE — Assessment & Plan Note (Addendum)
Repeat lipid panel: LDL not at goal Admits he is not taking atorvastatin as prescribed Advised to resume medication. Repeat lipid panel in 5months.

## 2021-04-09 MED ORDER — PIOGLITAZONE HCL 15 MG PO TABS: 15.0000 mg | ORAL_TABLET | Freq: Every day | ORAL | 1 refills | Status: DC

## 2021-05-17 ENCOUNTER — Encounter: Payer: Self-pay | Admitting: Cardiology

## 2021-05-17 ENCOUNTER — Other Ambulatory Visit: Payer: Self-pay

## 2021-05-17 ENCOUNTER — Ambulatory Visit (INDEPENDENT_AMBULATORY_CARE_PROVIDER_SITE_OTHER): Payer: PRIVATE HEALTH INSURANCE | Admitting: Cardiology

## 2021-05-17 VITALS — BP 120/82 | HR 69 | Ht 70.0 in | Wt 207.0 lb

## 2021-05-17 DIAGNOSIS — E785 Hyperlipidemia, unspecified: Secondary | ICD-10-CM

## 2021-05-17 DIAGNOSIS — I472 Ventricular tachycardia: Secondary | ICD-10-CM | POA: Diagnosis not present

## 2021-05-17 DIAGNOSIS — E1169 Type 2 diabetes mellitus with other specified complication: Secondary | ICD-10-CM | POA: Diagnosis not present

## 2021-05-17 DIAGNOSIS — Z794 Long term (current) use of insulin: Secondary | ICD-10-CM

## 2021-05-17 DIAGNOSIS — R002 Palpitations: Secondary | ICD-10-CM | POA: Diagnosis not present

## 2021-05-17 DIAGNOSIS — I4729 Other ventricular tachycardia: Secondary | ICD-10-CM | POA: Insufficient documentation

## 2021-05-17 MED ORDER — METOPROLOL SUCCINATE ER 50 MG PO TB24: 50.0000 mg | ORAL_TABLET | Freq: Every day | ORAL | 3 refills | Status: DC

## 2021-05-17 NOTE — Progress Notes (Signed)
Cardiology Office Note:    Date:  05/17/2021   ID:  Frank Roy, DOB 12/02/1960, MRN [id number]  PCP:  Anne Ng, NP  Cardiologist:  Gypsy Balsam, MD    Referring MD: Overton Mam, DO   Chief Complaint  Patient presents with   Follow-up  I am doing fine  History of Present Illness:    Frank Roy is a 60 y.o. male who was referred to [place] because of palpitations.  He does have diabetes which was poorly controlled as well as dyslipidemia he did not take medications for it.  Evaluation included echocardiogram which showed lower limits of normal ejection fraction, he also wore a monitor which surprisingly showed a very short run of nonsustained ventricular tachycardia.  Only 4 beats.  He comes today to talk about this.  He denies have any dizziness or passing out.  He still try to exercise on the regular basis have no problems doing this.  Past Medical History:  Diagnosis Date   Allergic rhinitis due to other allergen 03/23/2009   Qualifier: Diagnosis of  By: Yetta Barre MD, Bernadene Bell.    Asthma    GERD 03/23/2009   Qualifier: Diagnosis of  By: Yetta Barre MD, Bernadene Bell.    GERD (gastroesophageal reflux disease)    Hyperlipidemia associated with type 2 diabetes mellitus (HCC) 06/15/2018   Palpitations 01/21/2021   Type 2 diabetes mellitus without complications (HCC) 06/15/2018   Well adult exam 11/16/2013    History reviewed. No pertinent surgical history.  Current Medications: Current Meds  Medication Sig   atorvastatin (LIPITOR) 40 MG tablet Take 1 tablet (40 mg total) by mouth daily.   BD PEN NEEDLE MICRO U/F 32G X 6 MM MISC USE TO INJECT INSULIN UNDER THE SKIN IN THE MORNING AND AT BEDTIME (Patient taking differently: 1 each by Other route See admin instructions. To be use for glucose meds)   cetirizine (ZYRTEC) 10 MG tablet Take 1 tablet (10 mg total) by mouth at bedtime.   glucose blood (TRUE METRIX BLOOD GLUCOSE TEST) test strip Twice daily (Patient taking  differently: 1 each by Other route as needed for other (Glucose meds). Twice daily)   pioglitazone (ACTOS) 15 MG tablet Take 1 tablet (15 mg total) by mouth daily.     Allergies:   Lisinopril   Social History   Socioeconomic History   Marital status: Married    Spouse name: Not on file   Number of children: Not on file   Years of education: Not on file   Highest education level: Not on file  Occupational History   Not on file  Tobacco Use   Smoking status: Never   Smokeless tobacco: Never   Tobacco comments:    Regular Exercise-Yes  Vaping Use   Vaping Use: Never used  Substance and Sexual Activity   Alcohol use: Yes    Comment: socially   Drug use: No   Sexual activity: Yes  Other Topics Concern   Not on file  Social History Narrative   Not on file   Social Determinants of Health   Financial Resource Strain: Not on file  Food Insecurity: Not on file  Transportation Needs: Not on file  Physical Activity: Not on file  Stress: Not on file  Social Connections: Not on file     Family History: The patient's family history includes Hypertension in his father. There is no history of Stroke, Alcohol abuse, Cancer, COPD, Depression, Diabetes, Drug abuse, Early death, Hearing loss, Heart  disease, Hyperlipidemia, or Kidney disease. ROS:   Please see the history of present illness.    All 14 point review of systems negative except as described per history of present illness  EKGs/Labs/Other Studies Reviewed:      Recent Labs: 01/09/2021: Hemoglobin 13.9; Platelets 236 04/08/2021: ALT 11; BUN 11; Creatinine, Ser 1.08; Potassium 4.1; Sodium 138  Recent Lipid Panel    Component Value Date/Time   CHOL 232 (H) 04/08/2021 0923   TRIG 121.0 04/08/2021 0923   HDL 64.10 04/08/2021 0923   CHOLHDL 4 04/08/2021 0923   VLDL 24.2 04/08/2021 0923   LDLCALC 144 (H) 04/08/2021 0923   LDLDIRECT 158.5 04/18/2010 0905    Physical Exam:    VS:  BP 120/82 (BP Location: Right Arm,  Patient Position: Sitting)   Pulse 69   Ht 5\' 10"  (1.778 m)   Wt 207 lb (93.9 kg)   SpO2 98%   BMI 29.70 kg/m     Wt Readings from Last 3 Encounters:  05/17/21 207 lb (93.9 kg)  04/08/21 203 lb (92.1 kg)  02/18/21 203 lb (92.1 kg)     GEN:  Well nourished, well developed in no acute distress HEENT: Normal NECK: No JVD; No carotid bruits LYMPHATICS: No lymphadenopathy CARDIAC: RRR, no murmurs, no rubs, no gallops RESPIRATORY:  Clear to auscultation without rales, wheezing or rhonchi  ABDOMEN: Soft, non-tender, non-distended MUSCULOSKELETAL:  No edema; No deformity  SKIN: Warm and dry LOWER EXTREMITIES: no swelling NEUROLOGIC:  Alert and oriented x 3 PSYCHIATRIC:  Normal affect   ASSESSMENT:    1. Palpitations   2. Hyperlipidemia associated with type 2 diabetes mellitus (HCC)   3. Type 2 diabetes mellitus with other specified complication, with long-term current use of insulin (HCC)   4. Nonsustained ventricular tachycardia (HCC)    PLAN:    In order of problems listed above:  Nonsustained ventricular tachycardia/palpitations.  His echocardiogram showed low normal ejection fraction.  I will put him on small dose of beta-blocker.  We will start with metoprolol succinate 50 mg daily.  That should help with the ventricular arrhythmia as well as with a low normal ejection fraction. Dyslipidemia I did review his K PN from 04/08/2021 he had LDL of 144 HDL 64 but he admits today that he did not take his medication at that time.  He is back on Lipitor 40 mg which should get his cholesterol where it supposed to be. Diabetes.  He also tell me that he did not take care of his diabetes before his hemoglobin A1c was 6.9 in August 8.  Now he is getting better with diabetes control. Palpitations helpful but controlled with beta-blocker   Medication Adjustments/Labs and Tests Ordered: Current medicines are reviewed at length with the patient today.  Concerns regarding medicines are outlined  above.  No orders of the defined types were placed in this encounter.  Medication changes: No orders of the defined types were placed in this encounter.   Signed, 09-22[birth year]1980, MD, [facility] 05/17/2021 9:10 AM    [facility]

## 2021-05-17 NOTE — Addendum Note (Signed)
Addended by: Reynolds Bowl on: 05/17/2021 09:16 AM   Modules accepted: Orders

## 2021-05-17 NOTE — Patient Instructions (Signed)
Medication Instructions:  Your physician has recommended you make the following change in your medication:  START: Metoprolol succinate 50 mg twice daily *If you need a refill on your cardiac medications before your next appointment, please call your pharmacy*   Lab Work: None If you have labs (blood work) drawn today and your tests are completely normal, you will receive your results only by: MyChart Message (if you have MyChart) OR A paper copy in the mail If you have any lab test that is abnormal or we need to change your treatment, we will call you to review the results.   Testing/Procedures: None   Follow-Up: At [facility], you and your health needs are our priority.  As part of our continuing mission to provide you with exceptional heart care, we have created designated Provider Care Teams.  These Care Teams include your primary Cardiologist (physician) and Advanced Practice Providers (APPs -  Physician Assistants and Nurse Practitioners) who all work together to provide you with the care you need, when you need it.  We recommend signing up for the patient portal called "MyChart".  Sign up information is provided on this After Visit Summary.  MyChart is used to connect with patients for Virtual Visits (Telemedicine).  Patients are able to view lab/test results, encounter notes, upcoming appointments, etc.  Non-urgent messages can be sent to your provider as well.   To learn more about what you can do with MyChart, go to [email or web address].    Your next appointment:   3 month(s)  The format for your next appointment:   In Person  Provider:   Gypsy Balsam, MD   Other Instructions

## 2021-08-21 ENCOUNTER — Ambulatory Visit (INDEPENDENT_AMBULATORY_CARE_PROVIDER_SITE_OTHER): Payer: PRIVATE HEALTH INSURANCE | Admitting: Cardiology

## 2021-08-21 ENCOUNTER — Encounter: Payer: Self-pay | Admitting: Cardiology

## 2021-08-21 ENCOUNTER — Other Ambulatory Visit: Payer: Self-pay

## 2021-08-21 VITALS — BP 138/90 | HR 87 | Ht 70.0 in | Wt 209.0 lb

## 2021-08-21 DIAGNOSIS — E1169 Type 2 diabetes mellitus with other specified complication: Secondary | ICD-10-CM

## 2021-08-21 DIAGNOSIS — I4729 Other ventricular tachycardia: Secondary | ICD-10-CM | POA: Diagnosis not present

## 2021-08-21 DIAGNOSIS — R0789 Other chest pain: Secondary | ICD-10-CM

## 2021-08-21 DIAGNOSIS — R002 Palpitations: Secondary | ICD-10-CM | POA: Diagnosis not present

## 2021-08-21 DIAGNOSIS — R079 Chest pain, unspecified: Secondary | ICD-10-CM

## 2021-08-21 DIAGNOSIS — Z794 Long term (current) use of insulin: Secondary | ICD-10-CM

## 2021-08-21 DIAGNOSIS — E785 Hyperlipidemia, unspecified: Secondary | ICD-10-CM

## 2021-08-21 NOTE — Patient Instructions (Signed)
Medication Instructions:  Your physician recommends that you continue on your current medications as directed. Please refer to the Current Medication list given to you today.  *If you need a refill on your cardiac medications before your next appointment, please call your pharmacy*   Lab Work: Your physician recommends that you return for lab work in: 6 weeks You need to have labs done when you are fasting.  You can come Monday through Friday 8:30 am to 12:00 pm and 1:15 to 4:30. You do not need to make an appointment as the order has already been placed. The test is to check your lipids.  If you have labs (blood work) drawn today and your tests are completely normal, you will receive your results only by: MyChart Message (if you have MyChart) OR A paper copy in the mail If you have any lab test that is abnormal or we need to change your treatment, we will call you to review the results.   Testing/Procedures: Your physician has requested that you have a lexiscan myoview. For further information please visit [email or web address] Please follow instruction sheet, as given.  The test will take approximately 3 to 4 hours to complete; you may bring reading material.  If someone comes with you to your appointment, they will need to remain in the main lobby due to limited space in the testing area.   How to prepare for your Myocardial Perfusion Test: Do not eat or drink 3 hours prior to your test, except you may have water. Do not consume products containing caffeine (regular or decaffeinated) 12 hours prior to your test. (ex: coffee, chocolate, sodas, tea). Do bring a list of your current medications with you.  If not listed below, you may take your medications as normal. Do wear comfortable clothes (no dresses or overalls) and walking shoes, tennis shoes preferred (No heels or open toe shoes are allowed). Do NOT wear cologne, perfume, aftershave, or lotions (deodorant is allowed). If these  instructions are not followed, your test will have to be rescheduled.    Follow-Up: At [facility], you and your health needs are our priority.  As part of our continuing mission to provide you with exceptional heart care, we have created designated Provider Care Teams.  These Care Teams include your primary Cardiologist (physician) and Advanced Practice Providers (APPs -  Physician Assistants and Nurse Practitioners) who all work together to provide you with the care you need, when you need it.  We recommend signing up for the patient portal called "MyChart".  Sign up information is provided on this After Visit Summary.  MyChart is used to connect with patients for Virtual Visits (Telemedicine).  Patients are able to view lab/test results, encounter notes, upcoming appointments, etc.  Non-urgent messages can be sent to your provider as well.   To learn more about what you can do with MyChart, go to [email or web address].    Your next appointment:   6 month(s)  The format for your next appointment:   In Person  Provider:   Gypsy Balsam, MD   Other Instructions Cardiac Nuclear Scan A cardiac nuclear scan is a test that is done to check the flow of blood to your heart. It is done when you are resting and when you are exercising. The test looks for problems such as: Not enough blood reaching a portion of the heart. The heart muscle not working as it should. You may need this test if: You have heart disease. You have had lab  results that are not normal. You have had heart surgery or a balloon procedure to open up blocked arteries (angioplasty). You have chest pain. You have shortness of breath. In this test, a special dye (tracer) is put into your bloodstream. The tracer will travel to your heart. A camera will then take pictures of your heart to see how the tracer moves through your heart. This test is usually done at a hospital and takes 2-4 hours. Tell a doctor about: Any  allergies you have. All medicines you are taking, including vitamins, herbs, eye drops, creams, and over-the-counter medicines. Any problems you or family members have had with anesthetic medicines. Any blood disorders you have. Any surgeries you have had. Any medical conditions you have. Whether you are pregnant or may be pregnant. What are the risks? Generally, this is a safe test. However, problems may occur, such as: Serious chest pain and heart attack. This is only a risk if the stress portion of the test is done. Rapid heartbeat. A feeling of warmth in your chest. This feeling usually does not last long. Allergic reaction to the tracer. What happens before the test? Ask your doctor about changing or stopping your normal medicines. This is important. Follow instructions from your doctor about what you cannot eat or drink. Remove your jewelry on the day of the test. What happens during the test? An IV tube will be inserted into one of your veins. Your doctor will give you a small amount of tracer through the IV tube. You will wait for 20-40 minutes while the tracer moves through your bloodstream. Your heart will be monitored with an electrocardiogram (ECG). You will lie down on an exam table. Pictures of your heart will be taken for about 15-20 minutes. You may also have a stress test. For this test, one of these things may be done: You will be asked to exercise on a treadmill or a stationary bike. You will be given medicines that will make your heart work harder. This is done if you are unable to exercise. When blood flow to your heart has peaked, a tracer will again be given through the IV tube. After 20-40 minutes, you will get back on the exam table. More pictures will be taken of your heart. Depending on the tracer that is used, more pictures may need to be taken 3-4 hours later. Your IV tube will be removed when the test is over. The test may vary among doctors and  hospitals. What happens after the test? Ask your doctor: Whether you can return to your normal schedule, including diet, activities, and medicines. Whether you should drink more fluids. This will help to remove the tracer from your body. Drink enough fluid to keep your pee (urine) pale yellow. Ask your doctor, or the department that is doing the test: When will my results be ready? How will I get my results? Summary A cardiac nuclear scan is a test that is done to check the flow of blood to your heart. Tell your doctor whether you are pregnant or may be pregnant. Before the test, ask your doctor about changing or stopping your normal medicines. This is important. Ask your doctor whether you can return to your normal activities. You may be asked to drink more fluids. This information is not intended to replace advice given to you by your health care provider. Make sure you discuss any questions you have with your health care provider. Document Revised: 12/08/2018 Document Reviewed: 02/01/2018 Elsevier Patient  Education  2021 [employer].

## 2021-08-21 NOTE — Progress Notes (Signed)
Cardiology Office Note:    Date:  08/21/2021   ID:  Frank Roy, DOB 01/07/1961, MRN [id number]  PCP:  Anne Ng, NP  Cardiologist:  Gypsy Balsam, MD    Referring MD: Anne Ng, NP   Chief Complaint  Patient presents with   Follow-up  I am doing fine  History of Present Illness:    Frank Roy is a 60 y.o. male who was sent to [place] because of palpitations surprisingly he is monitor shows some runs of nonsustained ventricular tachycardia.  Echocardiogram performed after showed preserved left ventricle ejection fraction he does have multiple risk factors for coronary artery disease namely diabetes with latest hemoglobin A1c 6.9 from August, he does have dyslipidemia but does not take medication with his LDL of 144.  Likely does not smoke.  He described to have atypical chest pain which is typically located in the left side of his chest not related to exercise pinching like.  Very atypical.  Past Medical History:  Diagnosis Date   Allergic rhinitis due to other allergen 03/23/2009   Qualifier: Diagnosis of  By: Yetta Barre MD, Bernadene Bell.    Asthma    GERD 03/23/2009   Qualifier: Diagnosis of  By: Yetta Barre MD, Bernadene Bell.    GERD (gastroesophageal reflux disease)    Hyperlipidemia associated with type 2 diabetes mellitus (HCC) 06/15/2018   Palpitations 01/21/2021   Type 2 diabetes mellitus without complications (HCC) 06/15/2018   Well adult exam 11/16/2013    History reviewed. No pertinent surgical history.  Current Medications: Current Meds  Medication Sig   atorvastatin (LIPITOR) 40 MG tablet Take 1 tablet (40 mg total) by mouth daily.   BD PEN NEEDLE MICRO U/F 32G X 6 MM MISC USE TO INJECT INSULIN UNDER THE SKIN IN THE MORNING AND AT BEDTIME (Patient taking differently: 1 each by Other route See admin instructions. To be use for glucose meds)   cetirizine (ZYRTEC) 10 MG tablet Take 1 tablet (10 mg total) by mouth at bedtime.   glucose blood (TRUE METRIX BLOOD  GLUCOSE TEST) test strip Twice daily (Patient taking differently: 1 each by Other route as needed for other (Glucose meds). Twice daily)   metoprolol succinate (TOPROL-XL) 50 MG 24 hr tablet Take 1 tablet (50 mg total) by mouth daily. Take with or immediately following a meal.   pioglitazone (ACTOS) 15 MG tablet Take 1 tablet (15 mg total) by mouth daily.     Allergies:   Lisinopril   Social History   Socioeconomic History   Marital status: Married    Spouse name: Not on file   Number of children: Not on file   Years of education: Not on file   Highest education level: Not on file  Occupational History   Not on file  Tobacco Use   Smoking status: Never   Smokeless tobacco: Never   Tobacco comments:    Regular Exercise-Yes  Vaping Use   Vaping Use: Never used  Substance and Sexual Activity   Alcohol use: Yes    Comment: socially   Drug use: No   Sexual activity: Yes  Other Topics Concern   Not on file  Social History Narrative   Not on file   Social Determinants of Health   Financial Resource Strain: Not on file  Food Insecurity: Not on file  Transportation Needs: Not on file  Physical Activity: Not on file  Stress: Not on file  Social Connections: Not on file     Family  History: The patient's family history includes Hypertension in his father. There is no history of Stroke, Alcohol abuse, Cancer, COPD, Depression, Diabetes, Drug abuse, Early death, Hearing loss, Heart disease, Hyperlipidemia, or Kidney disease. ROS:   Please see the history of present illness.    All 14 point review of systems negative except as described per history of present illness  EKGs/Labs/Other Studies Reviewed:      Recent Labs: 01/09/2021: Hemoglobin 13.9; Platelets 236 04/08/2021: ALT 11; BUN 11; Creatinine, Ser 1.08; Potassium 4.1; Sodium 138  Recent Lipid Panel    Component Value Date/Time   CHOL 232 (H) 04/08/2021 0923   TRIG 121.0 04/08/2021 0923   HDL 64.10 04/08/2021 0923    CHOLHDL 4 04/08/2021 0923   VLDL 24.2 04/08/2021 0923   LDLCALC 144 (H) 04/08/2021 0923   LDLDIRECT 158.5 04/18/2010 0905    Physical Exam:    VS:  BP 138/90 (BP Location: Right Arm, Patient Position: Sitting)    Pulse 87    Ht 5\' 10"  (1.778 m)    Wt 209 lb (94.8 kg)    SpO2 94%    BMI 29.99 kg/m     Wt Readings from Last 3 Encounters:  08/21/21 209 lb (94.8 kg)  05/17/21 207 lb (93.9 kg)  04/08/21 203 lb (92.1 kg)     GEN:  Well nourished, well developed in no acute distress HEENT: Normal NECK: No JVD; No carotid bruits LYMPHATICS: No lymphadenopathy CARDIAC: RRR, no murmurs, no rubs, no gallops RESPIRATORY:  Clear to auscultation without rales, wheezing or rhonchi  ABDOMEN: Soft, non-tender, non-distended MUSCULOSKELETAL:  No edema; No deformity  SKIN: Warm and dry LOWER EXTREMITIES: no swelling NEUROLOGIC:  Alert and oriented x 3 PSYCHIATRIC:  Normal affect   ASSESSMENT:    1. Nonsustained ventricular tachycardia   2. Type 2 diabetes mellitus with other specified complication, with long-term current use of insulin (HCC)   3. Palpitations   4. Hyperlipidemia associated with type 2 diabetes mellitus (HCC)   5. Atypical chest pain    PLAN:    In order of problems listed above:  Nonsustained ventricular tachycardia he is on beta-blocker which I will continue.  He denies have any dizziness palpitations passing out.  However echocardiogram showed preserved ejection fraction for full risk stratification I will schedule him to have a stress test.  We will make sure he does not have any inducible ischemia. Type 2 diabetes.  Hemoglobin A1c 6.9 which is better than before this is from summer.  We did discuss need to take exercises on the regular basis which he already does and also medications. Dyslipidemia his cholesterol is absolutely unacceptable.  He does have diabetes therefore need to be on moderate intensity statin radialis with cholesterol is a give him 40 mg of Lipitor  however he does not take it I had a great discussion with him and I strongly recommended to do take medication explained to him the rationale for it.  Hopefully he will be taking that medication on the regular basis we will schedule him to have fasting lipid profile in 6 weeks. Atypical chest pain stress test will be done   Medication Adjustments/Labs and Tests Ordered: Current medicines are reviewed at length with the patient today.  Concerns regarding medicines are outlined above.  No orders of the defined types were placed in this encounter.  Medication changes: No orders of the defined types were placed in this encounter.   Signed, 06-27-1976, MD, [facility] 08/21/2021 9:35 AM  [facility]

## 2021-08-21 NOTE — Addendum Note (Signed)
Addended by: Eleonore Chiquito on: 08/21/2021 09:43 AM   Modules accepted: Orders

## 2021-08-22 NOTE — Addendum Note (Signed)
Addended by: Gypsy Balsam on: 08/22/2021 12:02 PM   Modules accepted: Orders

## 2021-10-03 ENCOUNTER — Ambulatory Visit (HOSPITAL_COMMUNITY)
Admission: RE | Admit: 2021-10-03 | Payer: PRIVATE HEALTH INSURANCE | Source: Ambulatory Visit | Attending: Cardiology | Admitting: Cardiology

## 2021-10-17 ENCOUNTER — Ambulatory Visit (HOSPITAL_COMMUNITY)
Admission: RE | Admit: 2021-10-17 | Payer: PRIVATE HEALTH INSURANCE | Source: Ambulatory Visit | Attending: Cardiology | Admitting: Cardiology

## 2021-11-12 ENCOUNTER — Encounter (HOSPITAL_COMMUNITY): Payer: Self-pay | Admitting: Cardiology

## 2021-11-13 ENCOUNTER — Ambulatory Visit (INDEPENDENT_AMBULATORY_CARE_PROVIDER_SITE_OTHER): Payer: PRIVATE HEALTH INSURANCE | Admitting: Nurse Practitioner

## 2021-11-13 ENCOUNTER — Other Ambulatory Visit: Payer: Self-pay

## 2021-11-13 ENCOUNTER — Encounter: Payer: Self-pay | Admitting: Nurse Practitioner

## 2021-11-13 VITALS — BP 126/80 | HR 72 | Temp 97.7°F | Ht 70.5 in | Wt 197.4 lb

## 2021-11-13 DIAGNOSIS — E1169 Type 2 diabetes mellitus with other specified complication: Secondary | ICD-10-CM | POA: Diagnosis not present

## 2021-11-13 DIAGNOSIS — E785 Hyperlipidemia, unspecified: Secondary | ICD-10-CM | POA: Diagnosis not present

## 2021-11-13 DIAGNOSIS — Z794 Long term (current) use of insulin: Secondary | ICD-10-CM

## 2021-11-13 LAB — COMPREHENSIVE METABOLIC PANEL
ALT: 12 U/L (ref 0–53)
AST: 13 U/L (ref 0–37)
Albumin: 4.1 g/dL (ref 3.5–5.2)
Alkaline Phosphatase: 108 U/L (ref 39–117)
BUN: 9 mg/dL (ref 6–23)
CO2: 30 mEq/L (ref 19–32)
Calcium: 9.3 mg/dL (ref 8.4–10.5)
Chloride: 103 mEq/L (ref 96–112)
Creatinine, Ser: 1.08 mg/dL (ref 0.40–1.50)
GFR: 73.81 mL/min (ref >60.00)
Glucose, Bld: 168 mg/dL — ABNORMAL HIGH (ref 70–99)
Potassium: 3.6 mEq/L (ref 3.5–5.1)
Sodium: 137 mEq/L (ref 135–145)
Total Bilirubin: 0.5 mg/dL (ref 0.2–1.2)
Total Protein: 7 g/dL (ref 6.0–8.3)

## 2021-11-13 LAB — LIPID PANEL
Cholesterol: 160 mg/dL (ref 0–200)
HDL: 55.9 mg/dL (ref >39.00)
LDL Cholesterol: 89 mg/dL (ref 0–99)
NonHDL: 104.53
Total CHOL/HDL Ratio: 3
Triglycerides: 79 mg/dL (ref 0.0–149.0)
VLDL: 15.8 mg/dL (ref 0.0–40.0)

## 2021-11-13 MED ORDER — FREESTYLE LIBRE READER DEVI: 1.0000 [IU] | Freq: Once | 0 refills | Status: AC

## 2021-11-13 MED ORDER — FREESTYLE LIBRE 14 DAY SENSOR MISC: 1.0000 [IU] | 11 refills | Status: DC

## 2021-11-13 NOTE — Progress Notes (Signed)
? ?Subjective:  ?Patient ID: Frank Roy, male    DOB: Oct 17, 1960  Age: 61 y.o. MRN: [id number] ? ?CC: Follow-up (Dm f/up) ? ?HPI ?Hyperlipidemia associated with type 2 diabetes mellitus (HCC) ?No adverse side effects with atorvastatin ?Repeat lipid panel today: LDL at goal ?Maintain med dose ?Refill sent ? ?DM (diabetes mellitus) (HCC) ?Home glucose: 97-236, checked maybe every 3-4days. ?Admits to non compliance with medication and diet. ?Denies any hypoglycemic episodes ?Up to date with DM eye exam. Report requested ?Normal Dm foot exam today. ? ?Repeat hgbA1c, lipid panel and CMP ?hgbA1c at 10%: uncontrolled ?Unsure why metformin was d/c in past ?Resume novolin and actos. ?Agreed to use CGM to improve compliance with glucose check. New rx sent. ?Advised about the importance of diet and medication compliance ?F/up in 36month   ? ?Reviewed past Medical, Social and Family history today. ? ?Outpatient Medications Prior to Visit  ?Medication Sig Dispense Refill  ? BD PEN NEEDLE MICRO U/F 32G X 6 MM MISC USE TO INJECT INSULIN UNDER THE SKIN IN THE MORNING AND AT BEDTIME (Patient taking differently: 1 each by Other route See admin instructions. To be use for glucose meds) 100 each 1  ? cetirizine (ZYRTEC) 10 MG tablet Take 1 tablet (10 mg total) by mouth at bedtime. 30 tablet 0  ? glucose blood (TRUE METRIX BLOOD GLUCOSE TEST) test strip Twice daily (Patient taking differently: 1 each by Other route as needed for other (Glucose meds). Twice daily) 100 each 12  ? atorvastatin (LIPITOR) 40 MG tablet Take 1 tablet (40 mg total) by mouth daily. 90 tablet 3  ? NOVOLIN 70/30 KWIKPEN (70-30) 100 UNIT/ML KwikPen SMARTSIG:10 Unit(s) SUB-Q Twice Daily    ? pioglitazone (ACTOS) 15 MG tablet Take 1 tablet (15 mg total) by mouth daily. 90 tablet 1  ? metoprolol succinate (TOPROL-XL) 50 MG 24 hr tablet Take 1 tablet (50 mg total) by mouth daily. Take with or immediately following a meal. 90 tablet 3  ? ?No facility-administered  medications prior to visit.  ? ?ROS ?See HPI ? ?Objective:  ?BP 126/80 (BP Location: Left Arm, Patient Position: Sitting, Cuff Size: Large)   Pulse 72   Temp 97.7 ?F (36.5 ?C) (Temporal)   Ht 5' 10.5" (1.791 m)   Wt 197 lb 6.4 oz (89.5 kg)   SpO2 96%   BMI 27.92 kg/m?  ? ?Physical Exam ?Cardiovascular:  ?   Rate and Rhythm: Normal rate and regular rhythm.  ?   Pulses: Normal pulses.  ?   Heart sounds: Normal heart sounds.  ?Pulmonary:  ?   Effort: Pulmonary effort is normal.  ?   Breath sounds: Normal breath sounds.  ?Musculoskeletal:  ?   Right lower leg: No edema.  ?   Left lower leg: No edema.  ?Skin: ?   General: Skin is warm and dry.  ?   Findings: No erythema, lesion or rash.  ?Neurological:  ?   Mental Status: He is alert and oriented to person, place, and time.  ? ?Assessment & Plan:  ?This visit occurred during the SARS-CoV-2 public health emergency.  Safety protocols were in place, including screening questions prior to the visit, additional usage of staff PPE, and extensive cleaning of exam room while observing appropriate contact time as indicated for disinfecting solutions.  ? ?Problem List Items Addressed This Visit   ? ?  ? Endocrine  ? DM (diabetes mellitus) (HCC) - Primary  ?  Home glucose: 97-236, checked maybe every 3-4days. ?Admits  to non compliance with medication and diet. ?Denies any hypoglycemic episodes ?Up to date with DM eye exam. Report requested ?Normal Dm foot exam today. ? ?Repeat hgbA1c, lipid panel and CMP ?hgbA1c at 10%: uncontrolled ?Unsure why metformin was d/c in past ?Resume novolin and actos. ?Agreed to use CGM to improve compliance with glucose check. New rx sent. ?Advised about the importance of diet and medication compliance ?F/up in 68month  ?  ?  ? Relevant Medications  ? Continuous Blood Gluc Sensor (FREESTYLE LIBRE 14 DAY SENSOR) MISC  ? atorvastatin (LIPITOR) 40 MG tablet  ? Other Relevant Orders  ? Hemoglobin A1c  ? Comprehensive metabolic panel (Completed)  ?  Lipid panel (Completed)  ? Hyperlipidemia associated with type 2 diabetes mellitus (HCC)  ?  No adverse side effects with atorvastatin ?Repeat lipid panel today: LDL at goal ?Maintain med dose ?Refill sent ?  ?  ? Relevant Medications  ? atorvastatin (LIPITOR) 40 MG tablet  ? Other Relevant Orders  ? Comprehensive metabolic panel (Completed)  ? Lipid panel (Completed)  ?  ?Follow-up: Return in about 4 weeks (around 12/11/2021) for CPE(fasting). ? ?Alysia Penna, NP ? ?

## 2021-11-13 NOTE — Patient Instructions (Addendum)
Go to lab for blood draw ? Check glucose daily in AM and before supper ?Bring glucose reading to next office visit. ?Sign medical release to get records from [facility] eye care ?

## 2021-11-13 NOTE — Assessment & Plan Note (Addendum)
No adverse side effects with atorvastatin ?Repeat lipid panel today: LDL at goal ?Maintain med dose ?Refill sent ?

## 2021-11-13 NOTE — Assessment & Plan Note (Addendum)
Home glucose: 97-236, checked maybe every 3-4days. ?Admits to non compliance with medication and diet. ?Denies any hypoglycemic episodes ?Up to date with DM eye exam. Report requested ?Normal Dm foot exam today. ? ?Repeat hgbA1c, lipid panel and CMP ?hgbA1c at 10%: uncontrolled ?Unsure why metformin was d/c in past ?Resume novolin and actos. ?Agreed to use CGM to improve compliance with glucose check. New rx sent. ?Advised about the importance of diet and medication compliance ?F/up in 5month  ?

## 2021-11-14 ENCOUNTER — Other Ambulatory Visit: Payer: PRIVATE HEALTH INSURANCE

## 2021-11-15 ENCOUNTER — Other Ambulatory Visit: Payer: Self-pay | Admitting: Nurse Practitioner

## 2021-11-15 DIAGNOSIS — E1169 Type 2 diabetes mellitus with other specified complication: Secondary | ICD-10-CM

## 2021-11-15 LAB — HEMOGLOBIN A1C
Hgb A1c MFr Bld: 10 % of total Hgb — ABNORMAL HIGH (ref <5.7)
Mean Plasma Glucose: 240 mg/dL
eAG (mmol/L): 13.3 mmol/L

## 2021-11-15 MED ORDER — NOVOLIN 70/30 FLEXPEN (70-30) 100 UNIT/ML ~~LOC~~ SUPN: 10.0000 [IU] | PEN_INJECTOR | Freq: Two times a day (BID) | SUBCUTANEOUS | 2 refills | Status: DC

## 2021-11-15 MED ORDER — ATORVASTATIN CALCIUM 40 MG PO TABS: 40.0000 mg | ORAL_TABLET | Freq: Every day | ORAL | 3 refills | Status: DC

## 2021-11-15 MED ORDER — PIOGLITAZONE HCL 15 MG PO TABS: 15.0000 mg | ORAL_TABLET | Freq: Every day | ORAL | 1 refills | Status: DC

## 2021-11-15 NOTE — Telephone Encounter (Signed)
Duplicate request

## 2021-11-26 ENCOUNTER — Telehealth (HOSPITAL_COMMUNITY): Payer: Self-pay | Admitting: Cardiology

## 2021-11-26 NOTE — Telephone Encounter (Signed)
To Dr. Georgina Quint, RN as an Lorain Childes. ? ?It looks like the order was actually for a myoview.  ?

## 2021-11-26 NOTE — Telephone Encounter (Signed)
Just an FYI. ?We have made several attempts to contact this patient including sending a letter to schedule or reschedule their echocardiogram. We will be removing the patient from the echo WQ. ? ?11/12/21 MAILED LETTER [facility]  ?11/12/21 [facility] to schedule @ 11:07/[facility]  ?CONSENT SIGNED  ?10/16/21 Patient cancelled due to not feeling well and wants to wait til late March to reschedule per Pam P at [facility]  ?08/23/21 [facility] to schedule @ 8:45/[facility]  ?08/22/21 [facility] to schedule @ 1:02/[facility]  ? ? ? ? ? ?Thank you  ?

## 2022-01-06 ENCOUNTER — Encounter: Payer: PRIVATE HEALTH INSURANCE | Admitting: Nurse Practitioner

## 2022-01-06 ENCOUNTER — Telehealth: Payer: Self-pay | Admitting: Nurse Practitioner

## 2022-01-06 NOTE — Telephone Encounter (Signed)
Pt was a no show for her CPE with Claris Gower on 01/06/22, I sent a no show letter.  ?

## 2022-01-17 ENCOUNTER — Other Ambulatory Visit: Payer: Self-pay

## 2022-01-17 DIAGNOSIS — E1169 Type 2 diabetes mellitus with other specified complication: Secondary | ICD-10-CM

## 2022-01-17 MED ORDER — NOVOLIN 70/30 FLEXPEN (70-30) 100 UNIT/ML ~~LOC~~ SUPN: 10.0000 [IU] | PEN_INJECTOR | Freq: Two times a day (BID) | SUBCUTANEOUS | 0 refills | Status: DC

## 2022-01-17 NOTE — Telephone Encounter (Signed)
Received rx request. Sent 30 day supply, let pharmacy know needs appt for further refills

## 2022-01-28 NOTE — Telephone Encounter (Signed)
1st no show, fee waived ?

## 2022-04-11 ENCOUNTER — Telehealth: Payer: Self-pay | Admitting: Nurse Practitioner

## 2022-04-11 NOTE — Telephone Encounter (Signed)
Caller Name: Victorino Fatzinger Call back phone #: [phone]  Reason for Call: Pt just arrived back from a 12 day trip and tested positive on a home test for Covid. Can pt be prescribed antibiotics without a visit? Preferred pharmacy [facility] [phone] [facility] [employer] - [place], [place] - [address] [facility]

## 2022-04-11 NOTE — Telephone Encounter (Signed)
Called & spoke w/ pt, looked to see if we have any appt's available via video or in person, nothing until 8/28. Adv pt he could call or visit , urgent care or the [facility]. Pt was confused on why he needed an appt or to be seen at all, explained to him that there isn't one specific medication for COVID, they will evaluate symptoms and treat them with w/ what they feel is neccessary. Pt understood and will be visiting one of the places suggested.

## 2022-07-22 ENCOUNTER — Other Ambulatory Visit: Payer: Self-pay | Admitting: Cardiology

## 2022-08-23 ENCOUNTER — Other Ambulatory Visit: Payer: Self-pay | Admitting: Cardiology

## 2022-10-24 ENCOUNTER — Ambulatory Visit: Payer: Self-pay

## 2022-12-08 IMAGING — CR DG CHEST 2V
2 series · 2 of 2 positions shown · non-contrast
Comparison: None

CLINICAL DATA: Chest pain, shortness of breath, heart racing,
cramping and LEFT side of chest, symptoms since [REDACTED], cough for 2
weeks, sinus drainage, BILATERAL calf cramping off and on for 3-5
days, diabetes mellitus

EXAM:
CHEST - 2 VIEW

[chest pa]
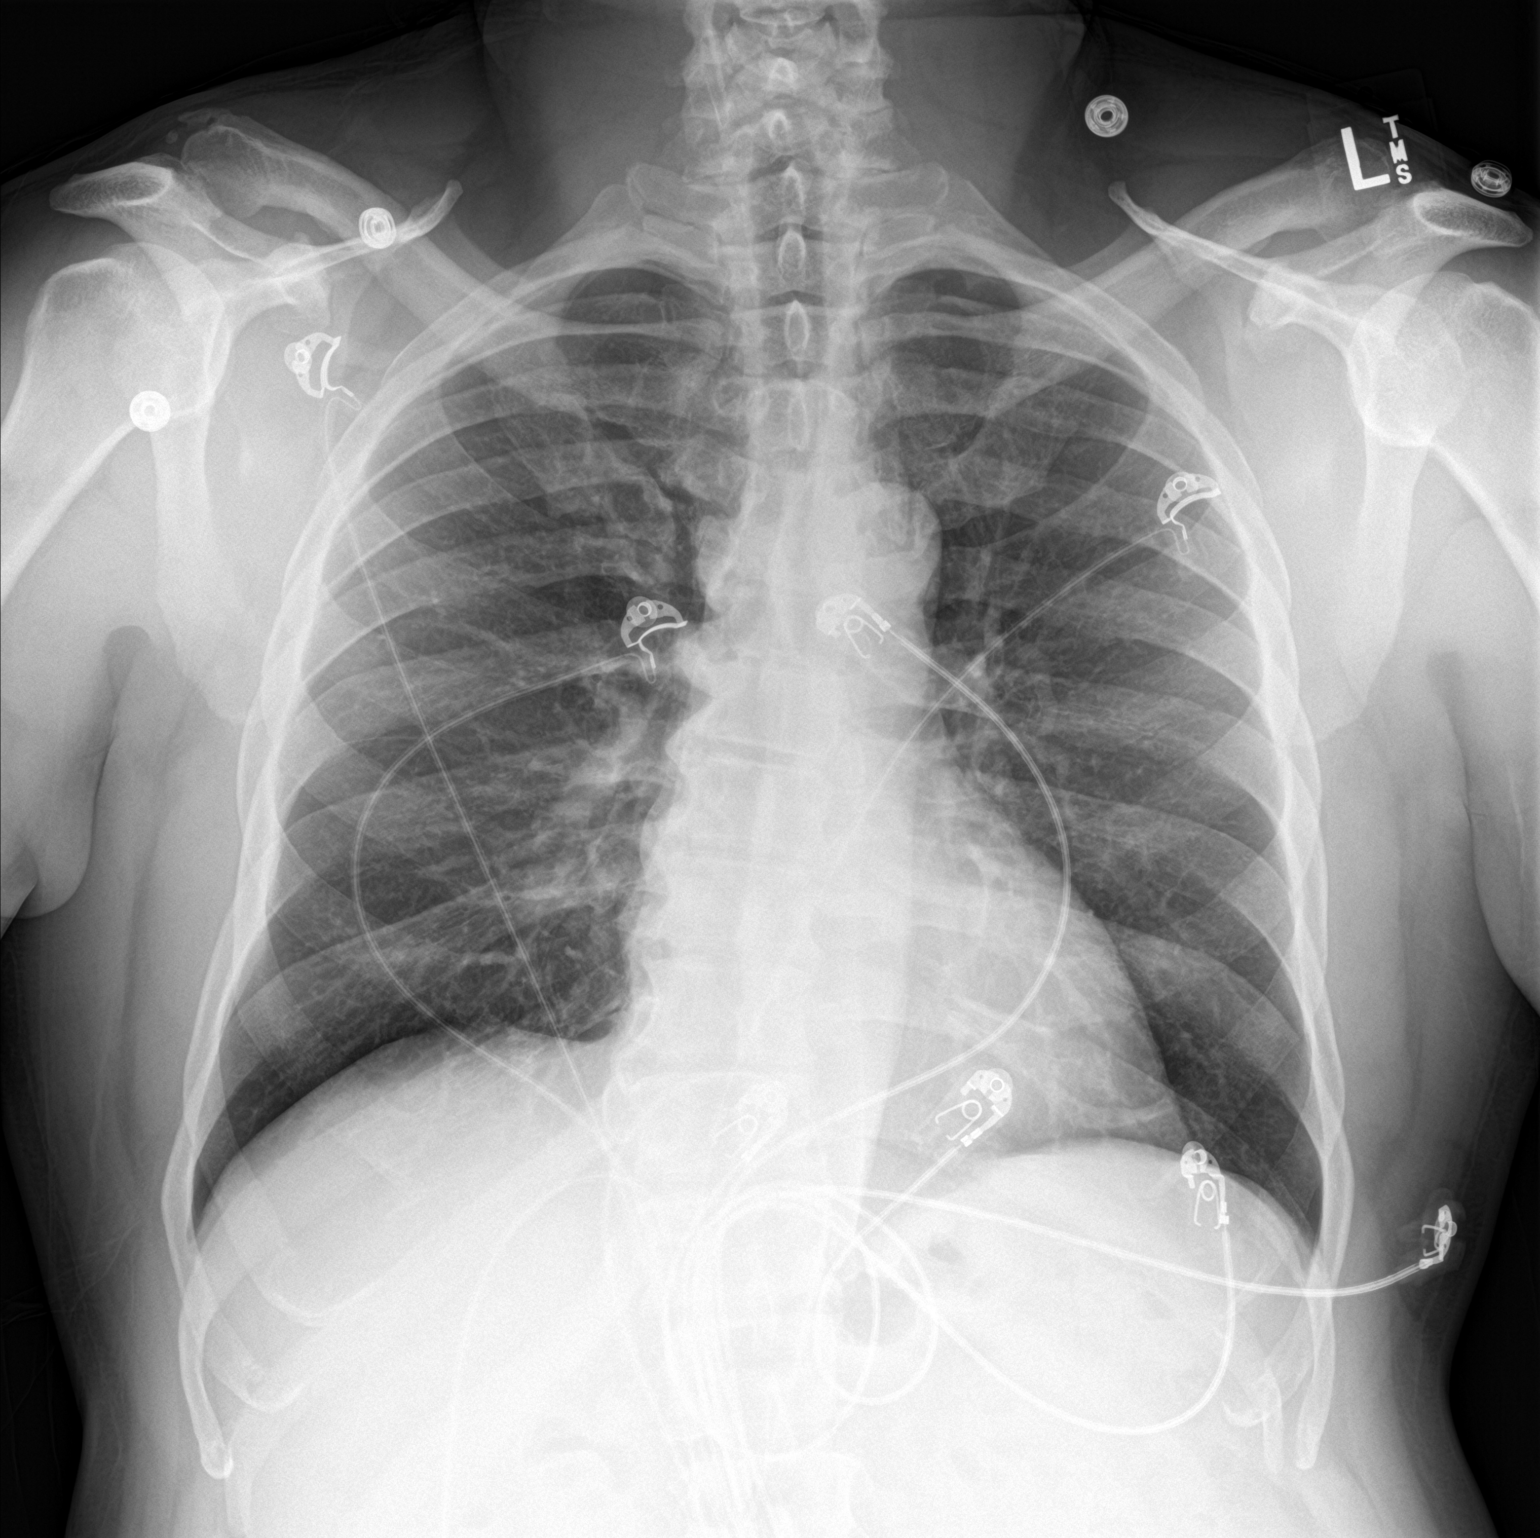

[chest lat]
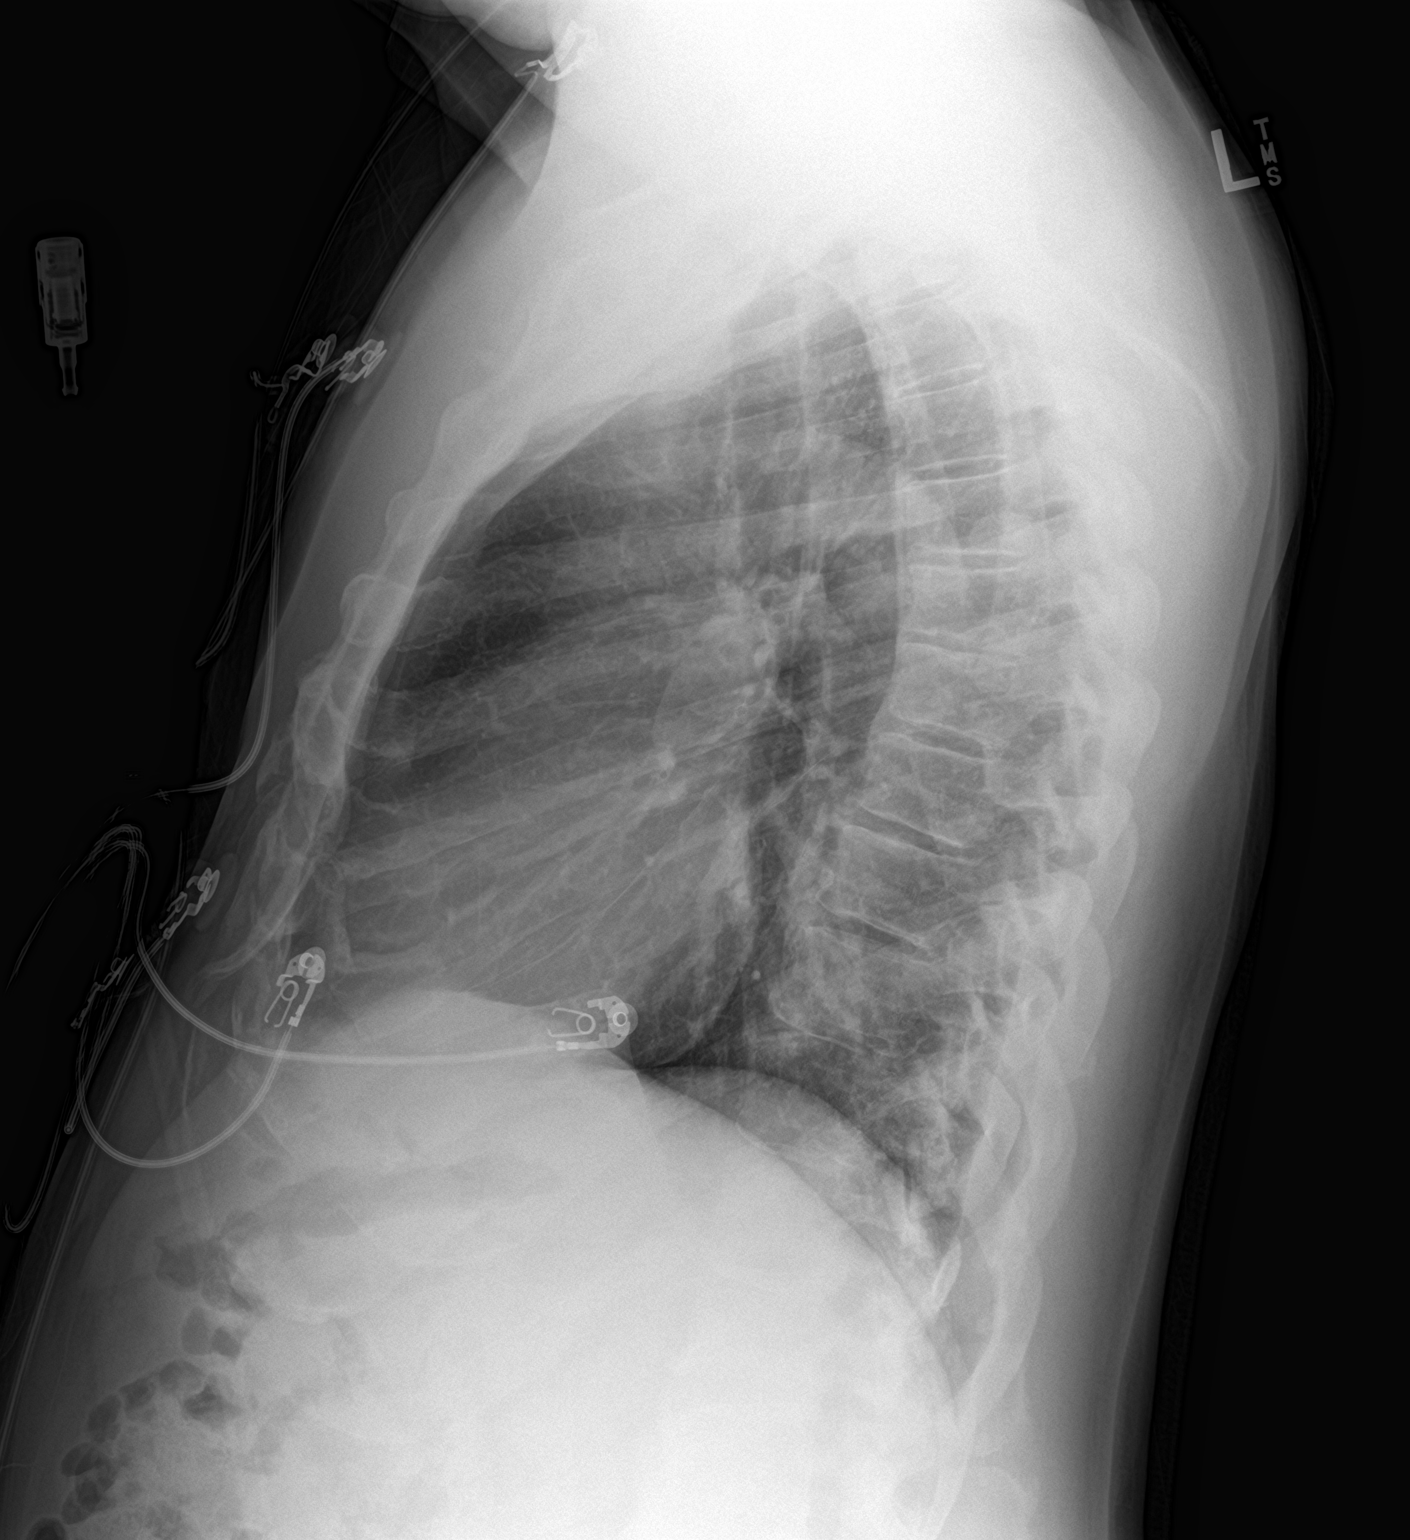

[2 of 2 positions shown; findings below may reference images not displayed]

FINDINGS: Normal heart size, mediastinal contours, and pulmonary vascularity.

Lungs clear.

No pulmonary infiltrate, pleural effusion, or pneumothorax.

Scattered endplate spur formation and biconvex scoliosis thoracic
spine.
IMPRESSION: No acute abnormalities.

## 2023-06-23 ENCOUNTER — Encounter (HOSPITAL_BASED_OUTPATIENT_CLINIC_OR_DEPARTMENT_OTHER): Payer: Self-pay

## 2023-06-23 ENCOUNTER — Other Ambulatory Visit: Payer: Self-pay

## 2023-06-23 ENCOUNTER — Emergency Department (HOSPITAL_BASED_OUTPATIENT_CLINIC_OR_DEPARTMENT_OTHER)
Admission: EM | Admit: 2023-06-23 | Discharge: 2023-06-23 | Disposition: A | Discharge status: Home / Self Care | Payer: BLUE CROSS/BLUE SHIELD | Attending: Emergency Medicine | Admitting: Emergency Medicine

## 2023-06-23 DIAGNOSIS — E1165 Type 2 diabetes mellitus with hyperglycemia: Secondary | ICD-10-CM | POA: Insufficient documentation

## 2023-06-23 DIAGNOSIS — R739 Hyperglycemia, unspecified: Secondary | ICD-10-CM | POA: Diagnosis present

## 2023-06-23 DIAGNOSIS — Z794 Long term (current) use of insulin: Secondary | ICD-10-CM | POA: Insufficient documentation

## 2023-06-23 DIAGNOSIS — E1169 Type 2 diabetes mellitus with other specified complication: Secondary | ICD-10-CM

## 2023-06-23 DIAGNOSIS — E119 Type 2 diabetes mellitus without complications: Secondary | ICD-10-CM

## 2023-06-23 LAB — CBC
HCT: 40.3 % (ref 39.0–52.0)
Hemoglobin: 13.8 g/dL (ref 13.0–17.0)
MCH: 29.6 pg (ref 26.0–34.0)
MCHC: 34.2 g/dL (ref 30.0–36.0)
MCV: 86.3 fL (ref 80.0–100.0)
Platelets: 213 10*3/uL (ref 150–400)
RBC: 4.67 MIL/uL (ref 4.22–5.81)
RDW: 12.2 % (ref 11.5–15.5)
WBC: 6.5 10*3/uL (ref 4.0–10.5)
nRBC: 0 % (ref 0.0–0.2)

## 2023-06-23 LAB — BASIC METABOLIC PANEL
Anion gap: 10 (ref 5–15)
BUN: 12 mg/dL (ref 8–23)
CO2: 28 mmol/L (ref 22–32)
Calcium: 9.2 mg/dL (ref 8.9–10.3)
Chloride: 97 mmol/L — ABNORMAL LOW (ref 98–111)
Creatinine, Ser: 1.12 mg/dL (ref 0.61–1.24)
GFR, Estimated: >60 mL/min (ref >60)
Glucose, Bld: 326 mg/dL — ABNORMAL HIGH (ref 70–99)
Potassium: 4.2 mmol/L (ref 3.5–5.1)
Sodium: 135 mmol/L (ref 135–145)

## 2023-06-23 LAB — URINALYSIS, MICROSCOPIC (REFLEX)

## 2023-06-23 LAB — URINALYSIS, ROUTINE W REFLEX MICROSCOPIC
Bilirubin Urine: NEGATIVE
Glucose, UA: >=500 mg/dL — AB
Hgb urine dipstick: NEGATIVE
Ketones, ur: 15 mg/dL — AB
Leukocytes,Ua: NEGATIVE
Nitrite: NEGATIVE
Protein, ur: NEGATIVE mg/dL
Specific Gravity, Urine: 1.015 (ref 1.005–1.030)
pH: 5.5 (ref 5.0–8.0)

## 2023-06-23 LAB — CBG MONITORING, ED: Glucose-Capillary: 341 mg/dL — ABNORMAL HIGH (ref 70–99)

## 2023-06-23 MED ORDER — NOVOLIN 70/30 FLEXPEN (70-30) 100 UNIT/ML ~~LOC~~ SUPN: 10.0000 [IU] | PEN_INJECTOR | Freq: Two times a day (BID) | SUBCUTANEOUS | 0 refills | Status: DC

## 2023-06-23 MED ORDER — FREESTYLE LIBRE 14 DAY SENSOR MISC: 1.0000 [IU] | 11 refills | Status: AC

## 2023-06-23 MED ORDER — SODIUM CHLORIDE 0.9 % IV BOLUS
1000.0000 mL | Freq: Once | INTRAVENOUS | Status: AC
Start: 2023-06-23 — End: 2023-06-23
  Administered 2023-06-23: 1000 mL via INTRAVENOUS

## 2023-06-23 MED ORDER — ATORVASTATIN CALCIUM 40 MG PO TABS: 40.0000 mg | ORAL_TABLET | Freq: Every day | ORAL | 3 refills | Status: AC

## 2023-06-23 MED ORDER — SODIUM CHLORIDE 0.9 % IV SOLN: INTRAVENOUS | Status: DC | PRN

## 2023-06-23 MED ORDER — PIOGLITAZONE HCL 15 MG PO TABS: 15.0000 mg | ORAL_TABLET | Freq: Every day | ORAL | 1 refills | Status: AC

## 2023-06-23 MED ORDER — BD PEN NEEDLE MICRO U/F 32G X 6 MM MISC: 1 refills | Status: AC

## 2023-06-23 MED ORDER — TRUE METRIX BLOOD GLUCOSE TEST VI STRP: ORAL_STRIP | 12 refills | Status: AC

## 2023-06-23 NOTE — ED Provider Notes (Signed)
[place] [facility] AT [facility] Provider Note   CSN: [id number] Arrival date & time: 06/23/23  8841     History  Chief Complaint  Patient presents with   Hyperglycemia    Frank Roy is a 62 y.o. male.  62 yo M with a chief complaint of hyperglycemia.  The patient has a history of diabetes he over the past couple years has tried to control it with diet and exercise.  Come off of the insulin on his own.  He felt his blood sugars were pretty well-controlled and then started feeling like they were not over the past 6 months to year.  He started feeling worse over the past few days to week.  Polyphagia polyuria polydipsia.  Mildly fatigued.  Unfortunately has been struggling to establish primary care.  Had recently changed insurances and has an appointment in April.  Denies any infectious symptoms.   Hyperglycemia      Home Medications Prior to Admission medications   Medication Sig Start Date End Date Taking? Authorizing Provider  atorvastatin (LIPITOR) 40 MG tablet Take 1 tablet (40 mg total) by mouth daily. 06/23/23   Melene Plan, DO  cetirizine (ZYRTEC) 10 MG tablet Take 1 tablet (10 mg total) by mouth at bedtime. 02/08/21   Nche, Bonna Gains, NP  Continuous Glucose Sensor (FREESTYLE LIBRE 14 DAY SENSOR) MISC 1 Units by Does not apply route every 14 (fourteen) days. 06/23/23   Melene Plan, DO  glucose blood (TRUE METRIX BLOOD GLUCOSE TEST) test strip Twice daily 06/23/23   Melene Plan, DO  Insulin Pen Needle (BD PEN NEEDLE MICRO U/F) 32G X 6 MM MISC USE TO INJECT INSULIN UNDER THE SKIN IN THE MORNING AND AT BEDTIME Strength: 32G X 6 MM 06/23/23   Melene Plan, DO  metoprolol succinate (TOPROL-XL) 50 MG 24 hr tablet Take 1 tablet (50 mg total) by mouth daily. TAKE WITH OR IMMEDIATELY FOLLOWING A MEAL. 07/23/22   Georgeanna Lea, MD  NOVOLIN 70/30 KWIKPEN (70-30) 100 UNIT/ML KwikPen Inject 10 Units into the skin 2 (two) times daily. 06/23/23   Melene Plan,  DO  pioglitazone (ACTOS) 15 MG tablet Take 1 tablet (15 mg total) by mouth daily. 06/23/23   Melene Plan, DO      Allergies    Metformin and Lisinopril    Review of Systems   Review of Systems  Physical Exam Updated Vital Signs BP (!) 160/93 (BP Location: Right Arm)   Pulse 70   Temp 97.6 F (36.4 C) (Oral)   Resp 18   Ht 5\' 10"  (1.778 m)   Wt 87.5 kg   SpO2 100%   BMI 27.69 kg/m  Physical Exam Vitals and nursing note reviewed.  Constitutional:      Appearance: He is well-developed.  HENT:     Head: Normocephalic and atraumatic.  Eyes:     Pupils: Pupils are equal, round, and reactive to light.  Neck:     Vascular: No JVD.  Cardiovascular:     Rate and Rhythm: Normal rate and regular rhythm.     Heart sounds: No murmur heard.    No friction rub. No gallop.  Pulmonary:     Effort: No respiratory distress.     Breath sounds: No wheezing.  Abdominal:     General: There is no distension.     Tenderness: There is no abdominal tenderness. There is no guarding or rebound.  Musculoskeletal:        General: Normal range  of motion.     Cervical back: Normal range of motion and neck supple.  Skin:    Coloration: Skin is not pale.     Findings: No rash.  Neurological:     Mental Status: He is alert and oriented to person, place, and time.  Psychiatric:        Behavior: Behavior normal.     ED Results / Procedures / Treatments   Labs (all labs ordered are listed, but only abnormal results are displayed) Labs Reviewed  BASIC METABOLIC PANEL - Abnormal; Notable for the following components:      Result Value   Chloride 97 (*)    Glucose, Bld 326 (*)    All other components within normal limits  URINALYSIS, ROUTINE W REFLEX MICROSCOPIC - Abnormal; Notable for the following components:   Glucose, UA >=500 (*)    Ketones, ur 15 (*)    All other components within normal limits  URINALYSIS, MICROSCOPIC (REFLEX) - Abnormal; Notable for the following components:    Bacteria, UA RARE (*)    All other components within normal limits  CBG MONITORING, ED - Abnormal; Notable for the following components:   Glucose-Capillary 341 (*)    All other components within normal limits  CBC  CBG MONITORING, ED    EKG None  Radiology No results found.  Procedures Procedures    Medications Ordered in ED Medications  0.9 %  sodium chloride infusion (has no administration in time range)  sodium chloride 0.9 % bolus 1,000 mL (0 mLs Intravenous Stopped 06/23/23 7829)    ED Course/ Medical Decision Making/ A&P                                 Medical Decision Making Amount and/or Complexity of Data Reviewed Labs: ordered.  Risk OTC drugs. Prescription drug management.   62 yo M with a chief complaints of hyperglycemia.  Will screen for diabetic ketoacidosis.  Patient had taken himself off of insulin.  He went to get it refilled did not realize that he needed another prescription.  Unfortunately has had trouble establishing new primary care.  Patient's lab work without diabetic ketoacidosis.  No anion gap elevation.  No leukocytosis no acute anemia.  UA negative for infection.  Will discharge the patient home.  PCP follow-up.  10:34 AM:  I have discussed the diagnosis/risks/treatment options with the patient.  Evaluation and diagnostic testing in the emergency department does not suggest an emergent condition requiring admission or immediate intervention beyond what has been performed at this time.  They will follow up with PCP. We also discussed returning to the ED immediately if new or worsening sx occur. We discussed the sx which are most concerning (e.g., sudden worsening pain, fever, inability to tolerate by mouth) that necessitate immediate return. Medications administered to the patient during their visit and any new prescriptions provided to the patient are listed below.  Medications given during this visit Medications  0.9 %  sodium chloride  infusion (has no administration in time range)  sodium chloride 0.9 % bolus 1,000 mL (0 mLs Intravenous Stopped 06/23/23 5621)     The patient appears reasonably screen and/or stabilized for discharge and I doubt any other medical condition or other [facility] requiring further screening, evaluation, or treatment in the ED at this time prior to discharge.          Final Clinical Impression(s) / ED Diagnoses Final  diagnoses:  Hyperglycemia    Rx / DC Orders ED Discharge Orders          Ordered    atorvastatin (LIPITOR) 40 MG tablet  Daily        06/23/23 0732    Insulin Pen Needle (BD PEN NEEDLE MICRO U/F) 32G X 6 MM MISC       Note to Pharmacy: DX Code Needed  .   06/23/23 0732    Continuous Glucose Sensor (FREESTYLE LIBRE 14 DAY SENSOR) MISC  Every 14 days        06/23/23 0732    glucose blood (TRUE METRIX BLOOD GLUCOSE TEST) test strip        06/23/23 0732    NOVOLIN 70/30 KWIKPEN (70-30) 100 UNIT/ML KwikPen  2 times daily       Note to Pharmacy: Needs appt for further refills   06/23/23 0732    pioglitazone (ACTOS) 15 MG tablet  Daily        06/23/23 0732              Melene Plan, DO 06/23/23 1034

## 2023-06-23 NOTE — ED Triage Notes (Signed)
Complaining of his blood sugar being up for the last couple of months. Normally 230 every day. Takes insulin normally but has been off of it for the last 6 months

## 2023-06-23 NOTE — Discharge Instructions (Signed)
Your lab work with the exception of your blood sugar being elevated looks good.  I have represcribed all of your medications as far as I can tell.  You do need to be seen by primary care provider.  Please contact your insurance company if you are having trouble finding a provider.  When you do contact your doctor please let them know you are just seen in the emergency department and they may be able to push up your appointment.

## 2023-12-23 ENCOUNTER — Other Ambulatory Visit: Payer: Self-pay

## 2023-12-23 ENCOUNTER — Encounter (HOSPITAL_BASED_OUTPATIENT_CLINIC_OR_DEPARTMENT_OTHER): Payer: Self-pay | Admitting: Emergency Medicine

## 2023-12-23 DIAGNOSIS — Z794 Long term (current) use of insulin: Secondary | ICD-10-CM | POA: Diagnosis not present

## 2023-12-23 DIAGNOSIS — J45909 Unspecified asthma, uncomplicated: Secondary | ICD-10-CM | POA: Diagnosis not present

## 2023-12-23 DIAGNOSIS — E1165 Type 2 diabetes mellitus with hyperglycemia: Secondary | ICD-10-CM | POA: Diagnosis not present

## 2023-12-23 DIAGNOSIS — R739 Hyperglycemia, unspecified: Secondary | ICD-10-CM | POA: Diagnosis present

## 2023-12-23 LAB — COMPREHENSIVE METABOLIC PANEL WITH GFR
ALT: 16 U/L (ref 0–44)
AST: 18 U/L (ref 15–41)
Albumin: 4.3 g/dL (ref 3.5–5.0)
Alkaline Phosphatase: 173 U/L — ABNORMAL HIGH (ref 38–126)
Anion gap: 13 (ref 5–15)
BUN: 12 mg/dL (ref 8–23)
CO2: 23 mmol/L (ref 22–32)
Calcium: 9.9 mg/dL (ref 8.9–10.3)
Chloride: 97 mmol/L — ABNORMAL LOW (ref 98–111)
Creatinine, Ser: 1.29 mg/dL — ABNORMAL HIGH (ref 0.61–1.24)
GFR, Estimated: >60 mL/min (ref >60)
Glucose, Bld: 493 mg/dL — ABNORMAL HIGH (ref 70–99)
Potassium: 4.4 mmol/L (ref 3.5–5.1)
Sodium: 133 mmol/L — ABNORMAL LOW (ref 135–145)
Total Bilirubin: 0.2 mg/dL (ref 0.0–1.2)
Total Protein: 7.8 g/dL (ref 6.5–8.1)

## 2023-12-23 LAB — CBC WITH DIFFERENTIAL/PLATELET
Abs Immature Granulocytes: 0.02 10*3/uL (ref 0.00–0.07)
Basophils Absolute: 0 10*3/uL (ref 0.0–0.1)
Basophils Relative: 1 %
Eosinophils Absolute: 0.5 10*3/uL (ref 0.0–0.5)
Eosinophils Relative: 7 %
HCT: 41.3 % (ref 39.0–52.0)
Hemoglobin: 14.5 g/dL (ref 13.0–17.0)
Immature Granulocytes: 0 %
Lymphocytes Relative: 28 %
Lymphs Abs: 2.1 10*3/uL (ref 0.7–4.0)
MCH: 30 pg (ref 26.0–34.0)
MCHC: 35.1 g/dL (ref 30.0–36.0)
MCV: 85.3 fL (ref 80.0–100.0)
Monocytes Absolute: 0.5 10*3/uL (ref 0.1–1.0)
Monocytes Relative: 7 %
Neutro Abs: 4.2 10*3/uL (ref 1.7–7.7)
Neutrophils Relative %: 57 %
Platelets: 232 10*3/uL (ref 150–400)
RBC: 4.84 MIL/uL (ref 4.22–5.81)
RDW: 12.3 % (ref 11.5–15.5)
WBC: 7.3 10*3/uL (ref 4.0–10.5)
nRBC: 0 % (ref 0.0–0.2)

## 2023-12-23 LAB — URINALYSIS, ROUTINE W REFLEX MICROSCOPIC
Bilirubin Urine: NEGATIVE
Glucose, UA: >=500 mg/dL — AB
Hgb urine dipstick: NEGATIVE
Ketones, ur: NEGATIVE mg/dL
Leukocytes,Ua: NEGATIVE
Nitrite: NEGATIVE
Protein, ur: NEGATIVE mg/dL
Specific Gravity, Urine: <=1.005 (ref 1.005–1.030)
pH: 5.5 (ref 5.0–8.0)

## 2023-12-23 LAB — URINALYSIS, MICROSCOPIC (REFLEX)

## 2023-12-23 LAB — CBG MONITORING, ED: Glucose-Capillary: 455 mg/dL — ABNORMAL HIGH (ref 70–99)

## 2023-12-23 NOTE — ED Triage Notes (Signed)
 Pt c/o elevated glucose (458 today); was out of insulin  for about 2 wks, but has been back on 70/30 since the beginning of April

## 2023-12-24 ENCOUNTER — Other Ambulatory Visit (HOSPITAL_BASED_OUTPATIENT_CLINIC_OR_DEPARTMENT_OTHER): Payer: Self-pay

## 2023-12-24 ENCOUNTER — Emergency Department (HOSPITAL_BASED_OUTPATIENT_CLINIC_OR_DEPARTMENT_OTHER)
Admission: EM | Admit: 2023-12-24 | Discharge: 2023-12-24 | Disposition: A | Discharge status: Home / Self Care | Attending: Emergency Medicine | Admitting: Emergency Medicine

## 2023-12-24 ENCOUNTER — Other Ambulatory Visit: Payer: Self-pay

## 2023-12-24 ENCOUNTER — Other Ambulatory Visit (HOSPITAL_COMMUNITY): Payer: Self-pay

## 2023-12-24 DIAGNOSIS — E1169 Type 2 diabetes mellitus with other specified complication: Secondary | ICD-10-CM

## 2023-12-24 DIAGNOSIS — R739 Hyperglycemia, unspecified: Secondary | ICD-10-CM

## 2023-12-24 LAB — CBG MONITORING, ED
Glucose-Capillary: 331 mg/dL — ABNORMAL HIGH (ref 70–99)
Glucose-Capillary: 173 mg/dL — ABNORMAL HIGH (ref 70–99)

## 2023-12-24 MED ORDER — INSULIN ASPART 100 UNIT/ML IJ SOLN
8.0000 [IU] | Freq: Once | INTRAMUSCULAR | Status: AC
  Administered 2023-12-24: 8 [IU] via INTRAVENOUS

## 2023-12-24 MED ORDER — LACTATED RINGERS IV BOLUS
1000.0000 mL | Freq: Once | INTRAVENOUS | Status: AC
  Administered 2023-12-24: 1000 mL via INTRAVENOUS

## 2023-12-24 MED ORDER — NOVOLIN 70/30 FLEXPEN (70-30) 100 UNIT/ML ~~LOC~~ SUPN
10.0000 [IU] | PEN_INJECTOR | Freq: Two times a day (BID) | SUBCUTANEOUS | 0 refills | Status: AC
  Filled 2023-12-24 (×3): qty 15, 75d supply, fill #0

## 2023-12-24 NOTE — Inpatient Diabetes Management (Signed)
 Inpatient Diabetes Program Recommendations  AACE/ADA: New Consensus Statement on Inpatient Glycemic Control (2015)  Target Ranges:  Prepandial:   less than 140 mg/dL      Peak postprandial:   less than 180 mg/dL (1-2 hours)      Critically ill patients:  140 - 180 mg/dL   Lab Results  Component Value Date   GLUCAP 173 (H) 12/24/2023   HGBA1C 10.0 (H) 11/14/2021     Latest Reference Range & Units 12/23/23 22:35 12/24/23 07:54 12/24/23 08:49  Glucose-Capillary 70 - 99 mg/dL 147 (H) 829 (H) 562 (H)  (H): Data is abnormally high  Diabetes history: DM2 Outpatient Diabetes medications: 70/30 10 units bid ac meals, Actos  15 mg daily Current orders for Inpatient glycemic control: None  Inpatient Diabetes Program Recommendations:   Noted patient out of insulin  x 2 weeks. Last endocrinologist office visit 12-01-23. Please consider: 70/30 10 units bid ac meals -Novolog  0-9 units tid, 0-5 units hs  Thank you, Ayleen Mckinstry E. Sharnae Winfree, RN, MSN, CDCES  Diabetes Coordinator Inpatient Glycemic Control Team Team Pager [phone] (8am-5pm) 12/24/2023 9:33 AM

## 2023-12-24 NOTE — ED Provider Notes (Signed)
 [place] [facility] Provider Note   CSN: [id number] Arrival date & time: 12/23/23  2216     History  Chief Complaint  Patient presents with   Hyperglycemia    Frank Roy is a 63 y.o. male.  HPI     64yo male with history of hyperlipidemia, type II DM, asthma, GERD, who presents with concern for hyperglycemia.   Glucose 458 today, was out of insulin  for about 2 weeks. Has been back on it since beginning of April 70/30-saw endocrinologist APP  Ran out of the novalin and was given humalin, then his PCP sent in the novalin. Still on the actos .    Glucose was as high as 600, then in 400s and 500s 10U BID, in the past was on 12, took 12 last night  No other dietary changes, infectious symptoms Increased thirst, increased frequency  No fever, no cough, no chest pain or dyspnea, diarrhea   Past Medical History:  Diagnosis Date   Allergic rhinitis due to other allergen 03/23/2009   Qualifier: Diagnosis of  By: Rochelle Chu MD, Randa Burton.    Asthma    GERD 03/23/2009   Qualifier: Diagnosis of  By: Rochelle Chu MD, Randa Burton.    GERD (gastroesophageal reflux disease)    Hyperlipidemia associated with type 2 diabetes mellitus (HCC) 06/15/2018   Palpitations 01/21/2021   Type 2 diabetes mellitus without complications (HCC) 06/15/2018   Well adult exam 11/16/2013     Home Medications Prior to Admission medications   Medication Sig Start Date End Date Taking? Authorizing Provider  atorvastatin  (LIPITOR) 40 MG tablet Take 1 tablet (40 mg total) by mouth daily. 06/23/23   Albertus Hughs, DO  cetirizine  (ZYRTEC ) 10 MG tablet Take 1 tablet (10 mg total) by mouth at bedtime. 02/08/21   Nche, Connye Delaine, NP  Continuous Glucose Sensor (FREESTYLE LIBRE 14 DAY SENSOR) MISC 1 Units by Does not apply route every 14 (fourteen) days. 06/23/23   Albertus Hughs, DO  glucose blood (TRUE METRIX BLOOD GLUCOSE TEST) test strip Twice daily 06/23/23   Albertus Hughs, DO  Insulin  Pen  Needle (BD PEN NEEDLE MICRO U/F) 32G X 6 MM MISC USE TO INJECT INSULIN  UNDER THE SKIN IN THE MORNING AND AT BEDTIME Strength: 32G X 6 MM 06/23/23   Albertus Hughs, DO  metoprolol  succinate (TOPROL -XL) 50 MG 24 hr tablet Take 1 tablet (50 mg total) by mouth daily. TAKE WITH OR IMMEDIATELY FOLLOWING A MEAL. 07/23/22   Krasowski, Robert J, MD  NOVOLIN  70/30 KWIKPEN (70-30) 100 UNIT/ML KwikPen Inject 10 Units into the skin 2 (two) times daily. 06/23/23   Albertus Hughs, DO  pioglitazone  (ACTOS ) 15 MG tablet Take 1 tablet (15 mg total) by mouth daily. 06/23/23   Floyd, Dan, DO      Allergies    Metformin  and Lisinopril     Review of Systems   Review of Systems  Physical Exam Updated Vital Signs BP (!) 142/86   Pulse 72   Temp 97.8 F (36.6 C)   Resp 18   Ht 5\' 10"  (1.778 m)   Wt 87.5 kg   SpO2 97%   BMI 27.68 kg/m  Physical Exam Vitals and nursing note reviewed.  Constitutional:      General: He is not in acute distress.    Appearance: He is well-developed. He is not diaphoretic.  HENT:     Head: Normocephalic and atraumatic.  Eyes:     Conjunctiva/sclera: Conjunctivae normal.  Cardiovascular:  Rate and Rhythm: Normal rate and regular rhythm.     Heart sounds: Normal heart sounds. No murmur heard.    No friction rub. No gallop.  Pulmonary:     Effort: Pulmonary effort is normal. No respiratory distress.     Breath sounds: Normal breath sounds. No wheezing or rales.  Abdominal:     General: There is no distension.     Palpations: Abdomen is soft.     Tenderness: There is no abdominal tenderness. There is no guarding.  Musculoskeletal:     Cervical back: Normal range of motion.  Skin:    General: Skin is warm and dry.  Neurological:     Mental Status: He is alert and oriented to person, place, and time.     ED Results / Procedures / Treatments   Labs (all labs ordered are listed, but only abnormal results are displayed) Labs Reviewed  COMPREHENSIVE METABOLIC PANEL WITH GFR  - Abnormal; Notable for the following components:      Result Value   Sodium 133 (*)    Chloride 97 (*)    Glucose, Bld 493 (*)    Creatinine, Ser 1.29 (*)    Alkaline Phosphatase 173 (*)    All other components within normal limits  URINALYSIS, ROUTINE W REFLEX MICROSCOPIC - Abnormal; Notable for the following components:   Glucose, UA >=500 (*)    All other components within normal limits  URINALYSIS, MICROSCOPIC (REFLEX) - Abnormal; Notable for the following components:   Bacteria, UA RARE (*)    All other components within normal limits  CBG MONITORING, ED - Abnormal; Notable for the following components:   Glucose-Capillary 455 (*)    All other components within normal limits  CBG MONITORING, ED - Abnormal; Notable for the following components:   Glucose-Capillary 331 (*)    All other components within normal limits  CBC WITH DIFFERENTIAL/PLATELET  CBG MONITORING, ED    EKG None  Radiology No results found.  Procedures Procedures    Medications Ordered in ED Medications  lactated ringers  bolus 1,000 mL (1,000 mLs Intravenous New Bag/Given 12/24/23 0757)  insulin  aspart (novoLOG ) injection 8 Units (8 Units Intravenous Given 12/24/23 0806)    ED Course/ Medical Decision Making/ A&P                                   63yo male with history of hyperlipidemia, type II DM, asthma, GERD, who presents with concern for hyperglycemia.   Labs obtained and personally evaluated interpreted by me show hyperglycemia without evidence of DKA or HHS.  Creatinine is mildly increased compared to prior, however GFR is normal.  Urinalysis without signs of urinary tract infection.  CBC without acute abnormalities.  Suspect his hyperglycemia is due to initially being off of medications, followed by medication change.  Given IV hydration and IV insulin  in the emergency department with improvement of glucose.  Will increase to 15U BID given significant hyperglycemia and recommend follow  up with his endocrinologist or PCP-discussed may need to be on other regimen (meal correction/basilar insulin ?)-but as he felt better with novalin can continue to advocate for that as he has with insurance company. Patient discharged in stable condition with understanding of reasons to return.           Final Clinical Impression(s) / ED Diagnoses Final diagnoses:  Hyperglycemia    Rx / DC Orders ED Discharge Orders  None         Scarlette Currier, MD 12/24/23 [phone]

## 2023-12-24 NOTE — Discharge Instructions (Addendum)
 Can increase Humalin to 15U twice a day with meals (starting this evening) if you are unable to get your Novolin  and call endocrinology or PCP.
